# Patient Record
Sex: Female | Born: 1960 | Hispanic: No | Marital: Single | Smoking: Former smoker
Health system: Southern US, Community
[De-identification: ages and names within clinical notes are randomized; demographics above are authoritative.]

## PROBLEM LIST (undated history)

## (undated) DIAGNOSIS — M8580 Other specified disorders of bone density and structure, unspecified site: Secondary | ICD-10-CM

## (undated) HISTORY — PX: CARPAL TUNNEL RELEASE: SHX101

## (undated) HISTORY — PX: TONSILLECTOMY: SUR1361

## (undated) HISTORY — DX: Other specified disorders of bone density and structure, unspecified site: M85.80

---

## 2001-08-13 ENCOUNTER — Other Ambulatory Visit: Admission: RE | Admit: 2001-08-13 | Discharge: 2001-08-13 | Payer: Self-pay | Admitting: Obstetrics and Gynecology

## 2002-10-09 ENCOUNTER — Other Ambulatory Visit: Admission: RE | Admit: 2002-10-09 | Discharge: 2002-10-09 | Payer: Self-pay | Admitting: Obstetrics and Gynecology

## 2003-10-27 ENCOUNTER — Other Ambulatory Visit: Admission: RE | Admit: 2003-10-27 | Discharge: 2003-10-27 | Payer: Self-pay | Admitting: Obstetrics and Gynecology

## 2003-12-01 ENCOUNTER — Encounter: Admission: RE | Admit: 2003-12-01 | Discharge: 2003-12-01 | Payer: Self-pay | Admitting: Obstetrics and Gynecology

## 2005-01-10 ENCOUNTER — Other Ambulatory Visit: Admission: RE | Admit: 2005-01-10 | Discharge: 2005-01-10 | Payer: Self-pay | Admitting: Obstetrics and Gynecology

## 2006-03-07 ENCOUNTER — Other Ambulatory Visit: Admission: RE | Admit: 2006-03-07 | Discharge: 2006-03-07 | Payer: Self-pay | Admitting: Obstetrics and Gynecology

## 2007-11-19 ENCOUNTER — Encounter: Admission: RE | Admit: 2007-11-19 | Discharge: 2007-11-19 | Payer: Self-pay | Admitting: Family Medicine

## 2012-04-08 ENCOUNTER — Emergency Department (HOSPITAL_COMMUNITY)
Admission: EM | Admit: 2012-04-08 | Discharge: 2012-04-08 | Disposition: A | Discharge status: Home / Self Care | Payer: Self-pay

## 2013-06-15 ENCOUNTER — Emergency Department (HOSPITAL_COMMUNITY)
Admission: EM | Admit: 2013-06-15 | Discharge: 2013-06-15 | Disposition: A | Discharge status: Home / Self Care | Payer: BC Managed Care – PPO | Attending: Emergency Medicine | Admitting: Emergency Medicine

## 2013-06-15 ENCOUNTER — Emergency Department (HOSPITAL_COMMUNITY): Payer: BC Managed Care – PPO

## 2013-06-15 DIAGNOSIS — S71009A Unspecified open wound, unspecified hip, initial encounter: Secondary | ICD-10-CM | POA: Insufficient documentation

## 2013-06-15 DIAGNOSIS — T148XXA Other injury of unspecified body region, initial encounter: Secondary | ICD-10-CM

## 2013-06-15 DIAGNOSIS — W268XXA Contact with other sharp object(s), not elsewhere classified, initial encounter: Secondary | ICD-10-CM | POA: Insufficient documentation

## 2013-06-15 DIAGNOSIS — R42 Dizziness and giddiness: Secondary | ICD-10-CM | POA: Insufficient documentation

## 2013-06-15 DIAGNOSIS — Y9389 Activity, other specified: Secondary | ICD-10-CM | POA: Insufficient documentation

## 2013-06-15 DIAGNOSIS — S71109A Unspecified open wound, unspecified thigh, initial encounter: Secondary | ICD-10-CM | POA: Insufficient documentation

## 2013-06-15 DIAGNOSIS — Z23 Encounter for immunization: Secondary | ICD-10-CM | POA: Insufficient documentation

## 2013-06-15 DIAGNOSIS — Z79899 Other long term (current) drug therapy: Secondary | ICD-10-CM | POA: Insufficient documentation

## 2013-06-15 DIAGNOSIS — Y929 Unspecified place or not applicable: Secondary | ICD-10-CM | POA: Insufficient documentation

## 2013-06-15 MED ORDER — TETANUS-DIPHTH-ACELL PERTUSSIS 5-2.5-18.5 LF-MCG/0.5 IM SUSP
0.5000 mL | Freq: Once | INTRAMUSCULAR | Status: AC
  Administered 2013-06-15: 0.5 mL via INTRAMUSCULAR
  Filled 2013-06-15: qty 0.5

## 2013-06-15 MED ORDER — CEPHALEXIN 500 MG PO CAPS: 500.0000 mg | ORAL_CAPSULE | Freq: Four times a day (QID) | ORAL | Status: DC

## 2013-06-15 MED ORDER — HYDROCODONE-ACETAMINOPHEN 5-325 MG PO TABS: 1.0000 | ORAL_TABLET | Freq: Three times a day (TID) | ORAL | Status: DC | PRN
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
## 2013-06-15 NOTE — ED Notes (Signed)
Patient transported to X-ray 
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
## 2013-06-15 NOTE — ED Provider Notes (Signed)
Medical screening examination/treatment/procedure(s) were conducted as a shared visit with non-physician practitioner(s) and myself.  I personally evaluated the patient during the encounter.  Right thigh tender at wound and proximally, distal NVI, right foot dorsalis pedis pulse intact cap refill less than 2 seconds normal light touch right foot good foot and leg sensation and strength patient does not appreciate any weakness, d/w TSurg for f/u clinic.  Hurman Horn, MD 06/16/13 [id number]

## 2013-06-15 NOTE — ED Notes (Signed)
Report received, assumed care.  

## 2013-06-18 ENCOUNTER — Telehealth (HOSPITAL_COMMUNITY): Payer: Self-pay | Admitting: Emergency Medicine

## 2013-06-18 NOTE — Telephone Encounter (Signed)
Patient going to see PCP today to have wound checked. I told her if her PCP is comfortable taking care of the wound it would make more sense for her to have it treated there. If they want her to follow up with [place] she will call back to schedule an appt.

## 2013-12-31 ENCOUNTER — Other Ambulatory Visit: Payer: Self-pay | Admitting: Obstetrics and Gynecology

## 2013-12-31 DIAGNOSIS — N6322 Unspecified lump in the left breast, upper inner quadrant: Secondary | ICD-10-CM

## 2014-01-13 ENCOUNTER — Ambulatory Visit
Admission: RE | Admit: 2014-01-13 | Discharge: 2014-01-13 | Disposition: A | Discharge status: Home / Self Care | Payer: BC Managed Care – PPO | Source: Ambulatory Visit | Attending: Obstetrics and Gynecology | Admitting: Obstetrics and Gynecology

## 2014-01-13 ENCOUNTER — Other Ambulatory Visit: Payer: BC Managed Care – PPO

## 2014-01-13 DIAGNOSIS — N6322 Unspecified lump in the left breast, upper inner quadrant: Secondary | ICD-10-CM

## 2014-09-08 ENCOUNTER — Other Ambulatory Visit: Payer: Self-pay

## 2014-09-08 DIAGNOSIS — Z1231 Encounter for screening mammogram for malignant neoplasm of breast: Secondary | ICD-10-CM

## 2014-09-22 ENCOUNTER — Ambulatory Visit
Admission: RE | Admit: 2014-09-22 | Discharge: 2014-09-22 | Disposition: A | Discharge status: Home / Self Care | Payer: BC Managed Care – PPO | Source: Ambulatory Visit

## 2014-09-22 DIAGNOSIS — Z1231 Encounter for screening mammogram for malignant neoplasm of breast: Secondary | ICD-10-CM

## 2016-04-18 DIAGNOSIS — G5601 Carpal tunnel syndrome, right upper limb: Secondary | ICD-10-CM | POA: Diagnosis not present

## 2016-04-18 DIAGNOSIS — G5602 Carpal tunnel syndrome, left upper limb: Secondary | ICD-10-CM | POA: Diagnosis not present

## 2016-06-19 DIAGNOSIS — K5289 Other specified noninfective gastroenteritis and colitis: Secondary | ICD-10-CM | POA: Diagnosis not present

## 2016-09-12 DIAGNOSIS — Z01419 Encounter for gynecological examination (general) (routine) without abnormal findings: Secondary | ICD-10-CM | POA: Diagnosis not present

## 2016-09-12 DIAGNOSIS — Z23 Encounter for immunization: Secondary | ICD-10-CM | POA: Diagnosis not present

## 2016-09-12 DIAGNOSIS — Z1231 Encounter for screening mammogram for malignant neoplasm of breast: Secondary | ICD-10-CM | POA: Diagnosis not present

## 2016-09-12 DIAGNOSIS — Z6824 Body mass index (BMI) 24.0-24.9, adult: Secondary | ICD-10-CM | POA: Diagnosis not present

## 2016-09-14 ENCOUNTER — Other Ambulatory Visit: Payer: Self-pay | Admitting: Obstetrics and Gynecology

## 2016-09-14 DIAGNOSIS — R928 Other abnormal and inconclusive findings on diagnostic imaging of breast: Secondary | ICD-10-CM

## 2016-09-20 ENCOUNTER — Ambulatory Visit
Admission: RE | Admit: 2016-09-20 | Discharge: 2016-09-20 | Disposition: A | Discharge status: Home / Self Care | Payer: BLUE CROSS/BLUE SHIELD | Source: Ambulatory Visit | Attending: Obstetrics and Gynecology | Admitting: Obstetrics and Gynecology

## 2016-09-20 DIAGNOSIS — R928 Other abnormal and inconclusive findings on diagnostic imaging of breast: Secondary | ICD-10-CM

## 2016-09-20 DIAGNOSIS — R922 Inconclusive mammogram: Secondary | ICD-10-CM | POA: Diagnosis not present

## 2016-11-07 DIAGNOSIS — G5603 Carpal tunnel syndrome, bilateral upper limbs: Secondary | ICD-10-CM | POA: Diagnosis not present

## 2016-11-07 DIAGNOSIS — G5602 Carpal tunnel syndrome, left upper limb: Secondary | ICD-10-CM | POA: Diagnosis not present

## 2016-11-07 DIAGNOSIS — G5601 Carpal tunnel syndrome, right upper limb: Secondary | ICD-10-CM | POA: Diagnosis not present

## 2016-11-18 DIAGNOSIS — G5601 Carpal tunnel syndrome, right upper limb: Secondary | ICD-10-CM | POA: Diagnosis not present

## 2016-11-18 DIAGNOSIS — G5602 Carpal tunnel syndrome, left upper limb: Secondary | ICD-10-CM | POA: Diagnosis not present

## 2016-11-21 DIAGNOSIS — Z13820 Encounter for screening for osteoporosis: Secondary | ICD-10-CM | POA: Diagnosis not present

## 2017-01-06 DIAGNOSIS — R05 Cough: Secondary | ICD-10-CM | POA: Diagnosis not present

## 2017-01-06 DIAGNOSIS — J018 Other acute sinusitis: Secondary | ICD-10-CM | POA: Diagnosis not present

## 2017-03-09 DIAGNOSIS — Z4789 Encounter for other orthopedic aftercare: Secondary | ICD-10-CM | POA: Diagnosis not present

## 2017-03-09 DIAGNOSIS — G5603 Carpal tunnel syndrome, bilateral upper limbs: Secondary | ICD-10-CM | POA: Diagnosis not present

## 2017-06-17 IMAGING — MG 2D DIGITAL DIAGNOSTIC UNILATERAL RIGHT MAMMOGRAM WITH CAD AND AD
6 series · 6 of 14 positions shown · non-contrast
Comparison: Previous exams including recent screening mammogram
dated 09/12/2016.

CLINICAL DATA: Patient returns today to evaluate a possible right
breast asymmetry questioned on recent screening mammogram.

EXAM:
2D DIGITAL DIAGNOSTIC UNILATERAL RIGHT MAMMOGRAM WITH CAD AND
ADJUNCT TOMO

[R CC synth-2D]
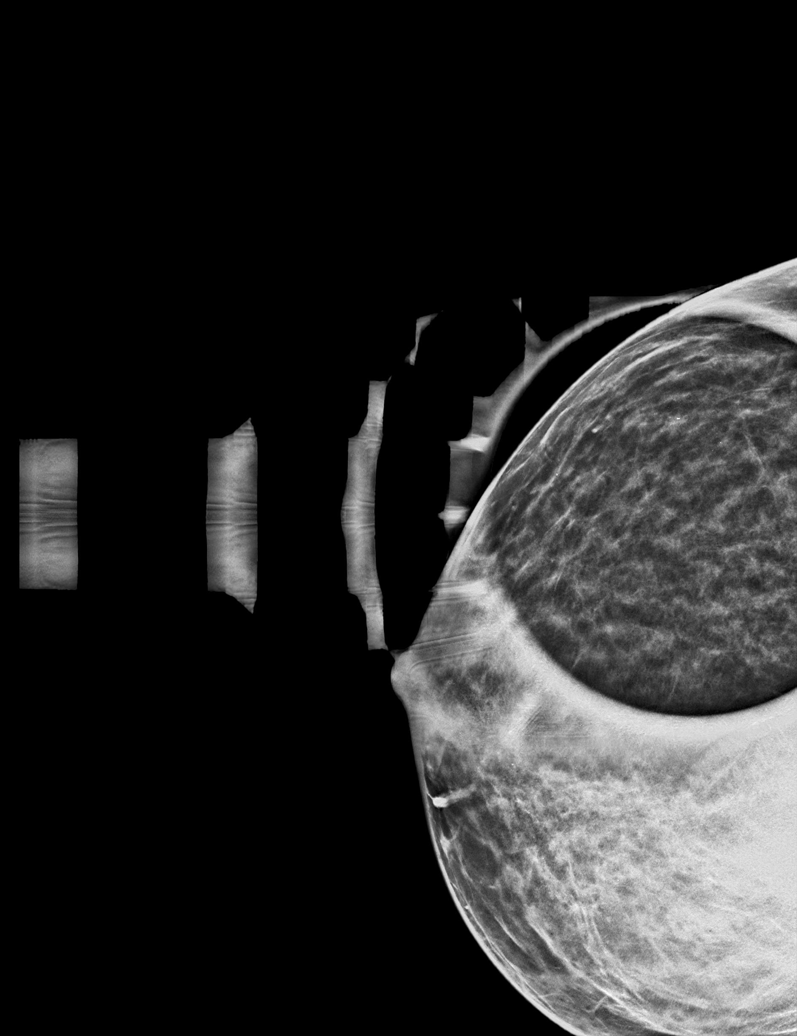

[R ML]
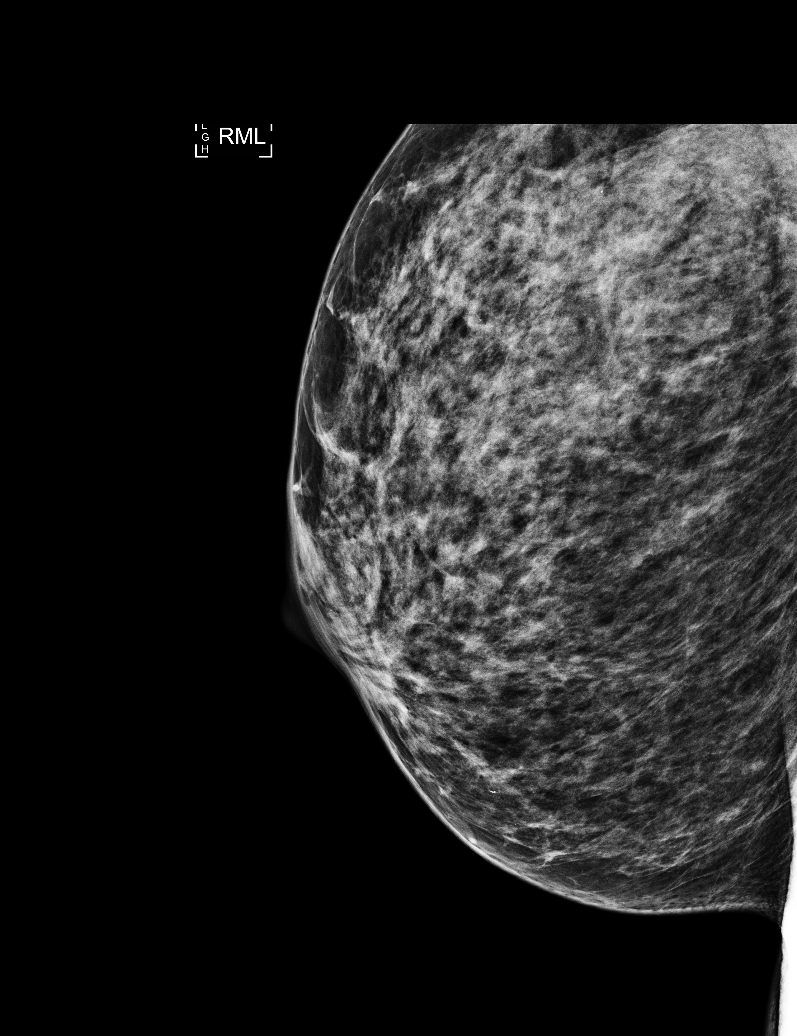

[R ML synth-2D]
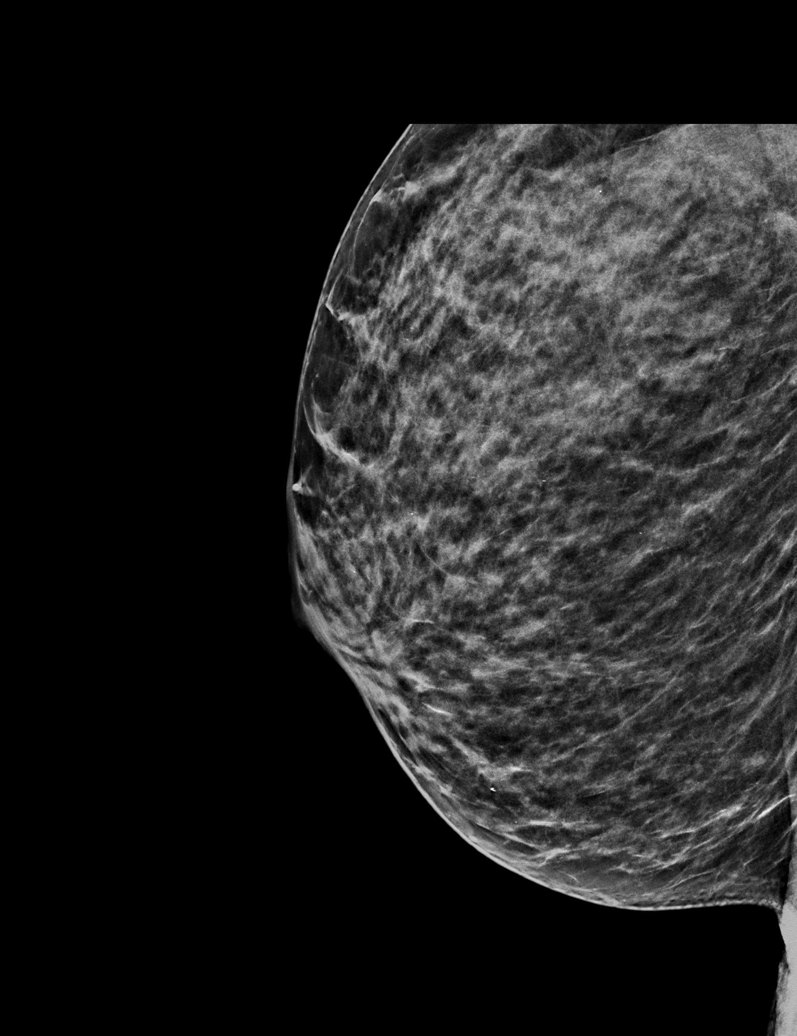

[R CC]
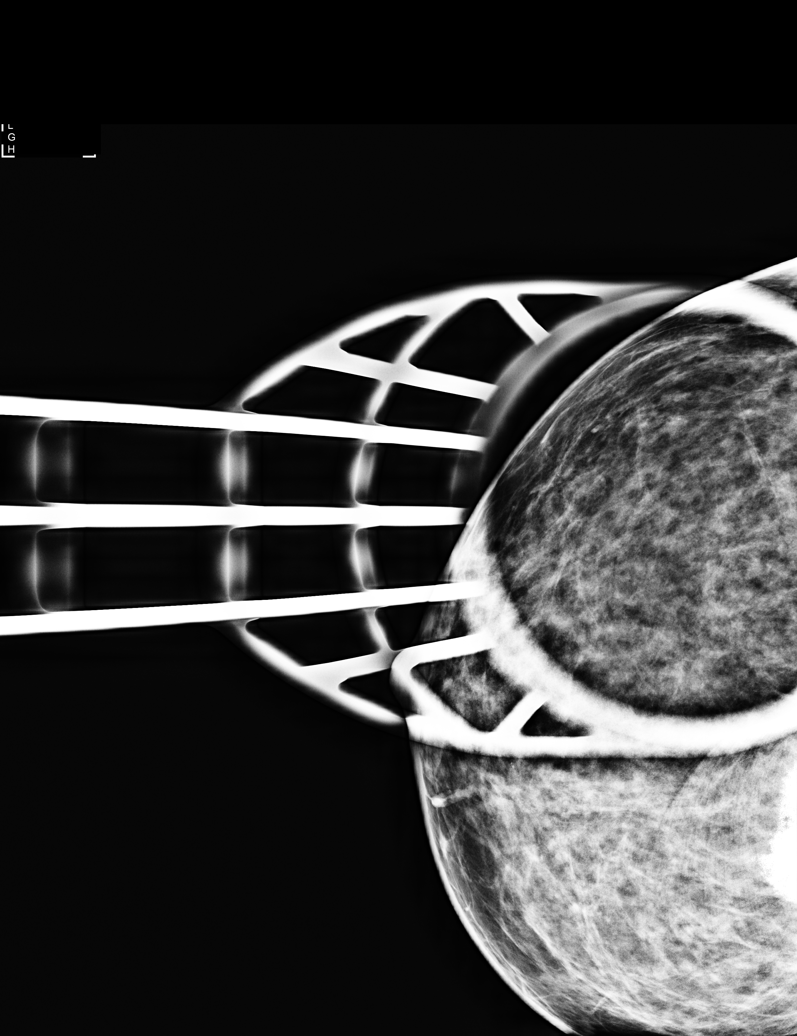

[R ML tomo · tomo slice 27/52.0]
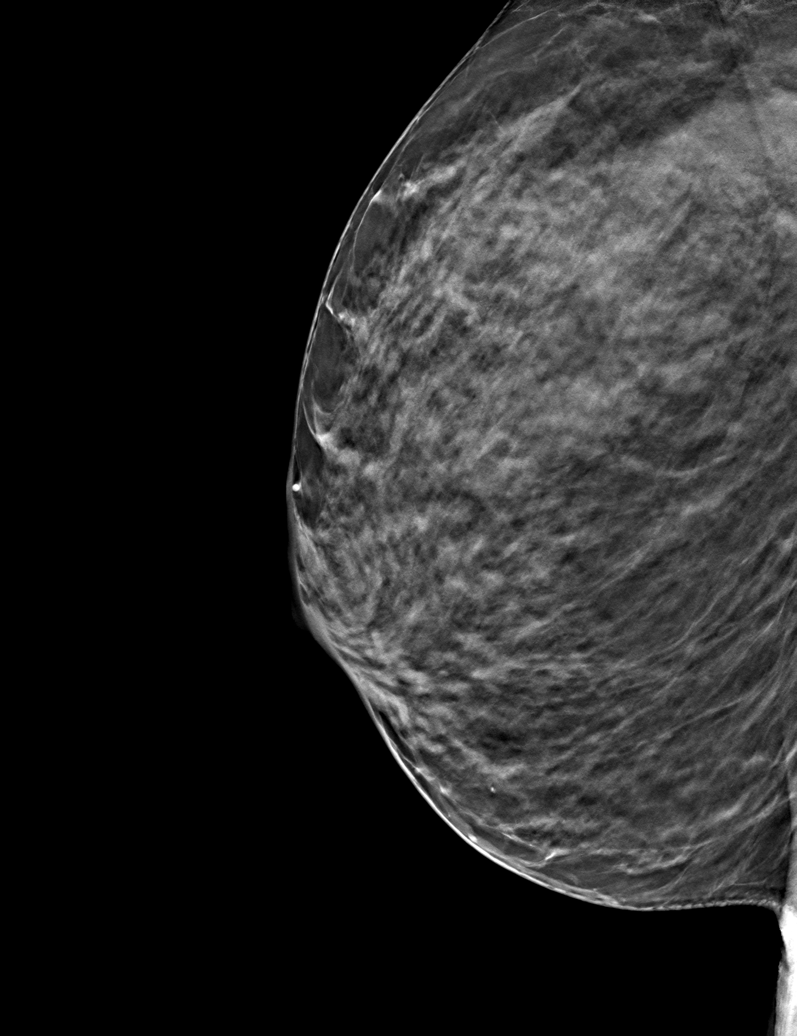

[R CC tomo · tomo slice 25/48.0]
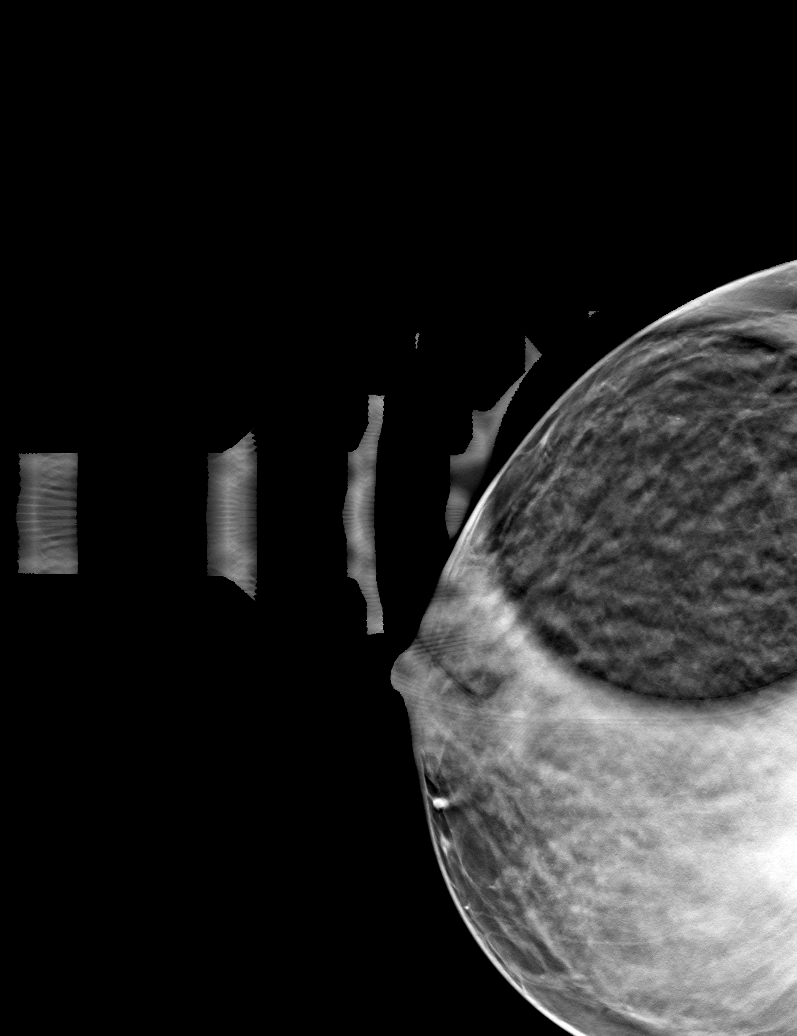

[6 of 14 positions shown; findings below may reference images not displayed]

ACR Breast Density Category c: The breast tissue is heterogeneously
dense, which may obscure small masses.
FINDINGS: On today's additional views with spot compression and 3D
tomosynthesis, there is no persistent asymmetry within the outer
right breast indicating superimposition of normal dense
fibroglandular tissues. There are no dominant masses, suspicious
calcifications or secondary signs of malignancy identified within
the right breast on today's exam.

Mammographic images were processed with CAD.
IMPRESSION: No evidence of malignancy.

Patient may return to routine annual bilateral screening mammogram
schedule.

RECOMMENDATION:
Screening mammogram in one year.(Code:[id number]-[id number])

I have discussed the findings and recommendations with the patient.
Results were also provided in writing at the conclusion of the
visit. If applicable, a reminder letter will be sent to the patient
regarding the next appointment.

BI-RADS CATEGORY  1: Negative.

## 2017-07-18 DIAGNOSIS — M25552 Pain in left hip: Secondary | ICD-10-CM | POA: Diagnosis not present

## 2017-07-20 ENCOUNTER — Ambulatory Visit
Admission: RE | Admit: 2017-07-20 | Discharge: 2017-07-20 | Disposition: A | Discharge status: Home / Self Care | Payer: BLUE CROSS/BLUE SHIELD | Source: Ambulatory Visit | Attending: Family Medicine | Admitting: Family Medicine

## 2017-07-20 ENCOUNTER — Other Ambulatory Visit: Payer: Self-pay | Admitting: Family Medicine

## 2017-07-20 DIAGNOSIS — M25552 Pain in left hip: Secondary | ICD-10-CM | POA: Diagnosis not present

## 2017-07-29 DIAGNOSIS — M25552 Pain in left hip: Secondary | ICD-10-CM | POA: Diagnosis not present

## 2017-10-02 DIAGNOSIS — Z1231 Encounter for screening mammogram for malignant neoplasm of breast: Secondary | ICD-10-CM | POA: Diagnosis not present

## 2017-10-02 DIAGNOSIS — Z01419 Encounter for gynecological examination (general) (routine) without abnormal findings: Secondary | ICD-10-CM | POA: Diagnosis not present

## 2017-10-02 DIAGNOSIS — Z6824 Body mass index (BMI) 24.0-24.9, adult: Secondary | ICD-10-CM | POA: Diagnosis not present

## 2017-10-12 DIAGNOSIS — Z13220 Encounter for screening for lipoid disorders: Secondary | ICD-10-CM | POA: Diagnosis not present

## 2017-10-12 DIAGNOSIS — Z1321 Encounter for screening for nutritional disorder: Secondary | ICD-10-CM | POA: Diagnosis not present

## 2017-10-12 DIAGNOSIS — Z13228 Encounter for screening for other metabolic disorders: Secondary | ICD-10-CM | POA: Diagnosis not present

## 2017-10-12 DIAGNOSIS — Z1329 Encounter for screening for other suspected endocrine disorder: Secondary | ICD-10-CM | POA: Diagnosis not present

## 2017-10-23 DIAGNOSIS — Z1283 Encounter for screening for malignant neoplasm of skin: Secondary | ICD-10-CM | POA: Diagnosis not present

## 2017-10-23 DIAGNOSIS — L821 Other seborrheic keratosis: Secondary | ICD-10-CM | POA: Diagnosis not present

## 2017-10-23 DIAGNOSIS — I781 Nevus, non-neoplastic: Secondary | ICD-10-CM | POA: Diagnosis not present

## 2018-01-24 ENCOUNTER — Ambulatory Visit
Admission: RE | Admit: 2018-01-24 | Discharge: 2018-01-24 | Disposition: A | Discharge status: Home / Self Care | Payer: BLUE CROSS/BLUE SHIELD | Source: Ambulatory Visit | Attending: Family Medicine | Admitting: Family Medicine

## 2018-01-24 ENCOUNTER — Other Ambulatory Visit: Payer: Self-pay | Admitting: Family Medicine

## 2018-01-24 DIAGNOSIS — M79672 Pain in left foot: Secondary | ICD-10-CM | POA: Diagnosis not present

## 2018-01-24 DIAGNOSIS — S99922A Unspecified injury of left foot, initial encounter: Secondary | ICD-10-CM | POA: Diagnosis not present

## 2018-01-24 DIAGNOSIS — W19XXXA Unspecified fall, initial encounter: Secondary | ICD-10-CM

## 2018-02-12 DIAGNOSIS — R739 Hyperglycemia, unspecified: Secondary | ICD-10-CM | POA: Diagnosis not present

## 2018-04-04 ENCOUNTER — Other Ambulatory Visit: Payer: Self-pay

## 2018-04-04 ENCOUNTER — Emergency Department (HOSPITAL_BASED_OUTPATIENT_CLINIC_OR_DEPARTMENT_OTHER): Payer: BLUE CROSS/BLUE SHIELD

## 2018-04-04 ENCOUNTER — Emergency Department (HOSPITAL_BASED_OUTPATIENT_CLINIC_OR_DEPARTMENT_OTHER)
Admission: EM | Admit: 2018-04-04 | Discharge: 2018-04-04 | Disposition: A | Discharge status: Home / Self Care | Payer: BLUE CROSS/BLUE SHIELD | Attending: Emergency Medicine | Admitting: Emergency Medicine

## 2018-04-04 ENCOUNTER — Encounter (HOSPITAL_BASED_OUTPATIENT_CLINIC_OR_DEPARTMENT_OTHER): Payer: Self-pay | Admitting: *Deleted

## 2018-04-04 DIAGNOSIS — R079 Chest pain, unspecified: Secondary | ICD-10-CM | POA: Insufficient documentation

## 2018-04-04 DIAGNOSIS — I456 Pre-excitation syndrome: Secondary | ICD-10-CM | POA: Diagnosis not present

## 2018-04-04 DIAGNOSIS — Z79899 Other long term (current) drug therapy: Secondary | ICD-10-CM | POA: Insufficient documentation

## 2018-04-04 DIAGNOSIS — R0781 Pleurodynia: Secondary | ICD-10-CM | POA: Diagnosis not present

## 2018-04-04 DIAGNOSIS — R002 Palpitations: Secondary | ICD-10-CM | POA: Diagnosis not present

## 2018-04-04 DIAGNOSIS — Z87891 Personal history of nicotine dependence: Secondary | ICD-10-CM | POA: Diagnosis not present

## 2018-04-04 LAB — CBC
HCT: 37.9 % (ref 36.0–46.0)
Hemoglobin: 13.4 g/dL (ref 12.0–15.0)
MCH: 31.9 pg (ref 26.0–34.0)
MCHC: 35.4 g/dL (ref 30.0–36.0)
MCV: 90.2 fL (ref 78.0–100.0)
Platelets: 227 10*3/uL (ref 150–400)
RBC: 4.2 MIL/uL (ref 3.87–5.11)
RDW: 11.9 % (ref 11.5–15.5)
WBC: 6 10*3/uL (ref 4.0–10.5)

## 2018-04-04 LAB — BASIC METABOLIC PANEL
Anion gap: 10 (ref 5–15)
BUN: 20 mg/dL (ref 6–20)
CO2: 26 mmol/L (ref 22–32)
Calcium: 9.4 mg/dL (ref 8.9–10.3)
Chloride: 99 mmol/L — ABNORMAL LOW (ref 101–111)
Creatinine, Ser: 0.69 mg/dL (ref 0.44–1.00)
GFR calc Af Amer: >60 mL/min (ref >60)
GFR calc non Af Amer: >60 mL/min (ref >60)
Glucose, Bld: 101 mg/dL — ABNORMAL HIGH (ref 65–99)
Potassium: 3.7 mmol/L (ref 3.5–5.1)
Sodium: 135 mmol/L (ref 135–145)

## 2018-04-04 LAB — TROPONIN I: Troponin I: <0.03 ng/mL (ref <0.03)

## 2018-04-04 MED ORDER — IOPAMIDOL (ISOVUE-370) INJECTION 76%
100.0000 mL | Freq: Once | INTRAVENOUS | Status: AC | PRN
  Administered 2018-04-04: 100 mL via INTRAVENOUS
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
## 2018-04-04 NOTE — Discharge Instructions (Addendum)
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  Follow-up with cardiology.
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
## 2018-04-06 ENCOUNTER — Ambulatory Visit (INDEPENDENT_AMBULATORY_CARE_PROVIDER_SITE_OTHER): Payer: BLUE CROSS/BLUE SHIELD | Admitting: Cardiology

## 2018-04-06 ENCOUNTER — Encounter: Payer: Self-pay | Admitting: Cardiology

## 2018-04-06 VITALS — BP 122/70 | HR 68 | Ht 60.5 in | Wt 128.8 lb

## 2018-04-06 DIAGNOSIS — R079 Chest pain, unspecified: Secondary | ICD-10-CM

## 2018-04-06 NOTE — Patient Instructions (Signed)
Medication Instructions:  Your physician recommends that you continue on your current medications as directed. Please refer to the Current Medication list given to you today.  Labwork: None  Testing/Procedures: Your physician has requested that you have a stress echocardiogram. For further information please visit [email or web address]. Please follow instruction sheet as given.   Follow-Up: Your physician recommends that you schedule a follow-up appointment in: As needed  Any Other Special Instructions Will Be Listed Below (If Applicable).     If you need a refill on your cardiac medications before your next appointment, please call your pharmacy.   [facility]  Garey HamAshley A, RN, BSN    Cardiopulmonary Exercise Stress Test Cardiopulmonary exercise testing (CPET) is a test that checks how your heart and lungs react to exercise. This is called your exercise capacity. During this test, you will walk or run on a treadmill or pedal on a stationary bike while tests are done on your heart and lungs. You may have this test to:  See why you are short of breath.  Check for exercise intolerance.  See how your lungs work.  See how your heart works.  Check for how you are responding to a heart or lung rehabilitation program.  See if you have a heart or lung problem.  See if you are healthy enough to have surgery.  What happens before the procedure?  Follow instructions from your doctor about what you cannot eat or drink.  Ask your doctor about changing or stopping your normal medicines. This is important if you take diabetes medicines or blood thinners.  Wear loose, comfortable clothing and shoes.  If you use an inhaler, bring it with you to the test. What happens during the procedure?  A blood pressure cuff will be placed on your arm.  Several stick-on patches (electrodes) will be placed on your chest. They will be attached to an electrocardiogram (EKG) machine.  A  clip-on monitor that measures the amount of oxygen in your blood will be placed on your finger (pulse oximeter).  A clip will be placed on your nose and a mouthpiece will be placed in your mouth. This may be held in place with a headpiece. You will breathe through the mouthpiece during the test.  You will be asked to start exercising. You will be closely watched while you exercise.  The amount of effort for your exercise will be gradually increased.  During exercise, the test will measure: ? Your heart rate. ? Your heart rhythm. ? Your oxygen blood level. ? The amount of oxygen and carbon dioxide that you breathe out.  The test will end when: ? You have finished the test. ? You have reached your maximum ability to exercise. ? You have chest or leg pain, dizziness, or shortness of breath. The procedure may vary among doctors and hospitals. What happens after the procedure?  Your blood pressure and EKG will be checked to watch how you recover from the test. This information is not intended to replace advice given to you by your health care provider. Make sure you discuss any questions you have with your health care provider. Document Released: 11/23/2009 Document Revised: 04/26/2016 Document Reviewed: 10/19/2015 Elsevier Interactive Patient Education  2018 [employer]

## 2018-04-06 NOTE — Progress Notes (Signed)
Cardiology Office Note:    Date:  04/06/2018   ID:  Katrina Warren, DOB 11/27/1961, MRN [id number]  PCP:  Shaune PollackGates, Donna, MD  Cardiologist:  Garwin Brothersajan R Ceclia Koker, MD   Referring MD: Shaune PollackGates, Donna, MD    ASSESSMENT:    1. Chest pain, unspecified type    PLAN:    In order of problems listed above:  1. Primary prevention stressed with the patient.  Importance of compliance with diet and exercise stressed and she vocalized understanding.  She has an excellent exercise program.  To reassure her I will obtain a stat exercise stress echo.  This will also help me assess murmur heard on auscultation.  Overall I think she is in excellent health and health maintenance issues such as annual lipids are followed by her primary care physician.  If her stress test is negative then she will be followed on a as needed basis only.   Medication Adjustments/Labs and Tests Ordered: Current medicines are reviewed at length with the patient today.  Concerns regarding medicines are outlined above.  Orders Placed This Encounter  Procedures  . ECHOCARDIOGRAM STRESS TEST   No orders of the defined types were placed in this encounter.    History of Present Illness:    Katrina Warren is a 57 y.o. female who is being seen today for the evaluation of chest pain at the request of Shaune PollackGates, Donna, MD.  Patient is a pleasant 57 year old female who is otherwise healthy.  She mentions to me that she occasionally has a skipped beat sensation.  This has some chest discomfort to it.  No orthopnea or PND.  She is very concerned about the symptoms.  She denies any significant history from a cardiovascular standpoint.  No history of hypertension, diabetes mellitus and dyslipidemia.  She exercises on a regular basis 30-40 minutes a day without any symptoms.  At the time of my evaluation, the patient is alert awake oriented and in no distress.  At the time of my evaluation, the patient is alert awake oriented and in no  distress.  History reviewed. No pertinent past medical history.  Past Surgical History:  Procedure Laterality Date  . CARPAL TUNNEL RELEASE    . TONSILLECTOMY      Current Medications: Current Meds  Medication Sig  . Ascorbic Acid (VITAMIN C) 100 MG tablet Take 100 mg by mouth daily.  Marland Kitchen. b complex vitamins tablet Take 1 tablet by mouth daily.  . cholecalciferol (VITAMIN D) 1000 units tablet Take 1,000 Units by mouth daily.  . magnesium oxide (MAG-OX) 400 MG tablet Take 400 mg by mouth daily.  Marland Kitchen. OVER THE COUNTER MEDICATION Take 1 tablet by mouth daily. lysene 500mg   . zolpidem (AMBIEN) 5 MG tablet Take 5 mg by mouth at bedtime as needed for sleep.     Allergies:   Patient has no known allergies.   Social History   Socioeconomic History  . Marital status: Single    Spouse name: Not on file  . Number of children: Not on file  . Years of education: Not on file  . Highest education level: Not on file  Occupational History  . Not on file  Social Needs  . Financial resource strain: Not on file  . Food insecurity:    Worry: Not on file    Inability: Not on file  . Transportation needs:    Medical: Not on file    Non-medical: Not on file  Tobacco Use  . Smoking status:  Former Smoker  . Smokeless tobacco: Never Used  Substance and Sexual Activity  . Alcohol use: Never    Frequency: Never  . Drug use: Never  . Sexual activity: Not on file  Lifestyle  . Physical activity:    Days per week: Not on file    Minutes per session: Not on file  . Stress: Not on file  Relationships  . Social connections:    Talks on phone: Not on file    Gets together: Not on file    Attends religious service: Not on file    Active member of club or organization: Not on file    Attends meetings of clubs or organizations: Not on file    Relationship status: Not on file  Other Topics Concern  . Not on file  Social History Narrative  . Not on file     Family History: The patient's family  history is not on file.  ROS:   Please see the history of present illness.    All other systems reviewed and are negative.  EKGs/Labs/Other Studies Reviewed:    The following studies were reviewed today: EKG reveals sinus rhythm and nonspecific ST-T changes   Recent Labs: 04/04/2018: BUN 20; Creatinine, Ser 0.69; Hemoglobin 13.4; Platelets 227; Potassium 3.7; Sodium 135  Recent Lipid Panel No results found for: CHOL, TRIG, HDL, CHOLHDL, VLDL, LDLCALC, LDLDIRECT  Physical Exam:    VS:  BP 122/70 (BP Location: Left Arm, Patient Position: Sitting, Cuff Size: Normal)   Pulse 68   Ht 5' 0.5" (1.537 m)   Wt 128 lb 12.8 oz (58.4 kg)   SpO2 99%   BMI 24.74 kg/m     Wt Readings from Last 3 Encounters:  04/06/18 128 lb 12.8 oz (58.4 kg)  04/04/18 125 lb (56.7 kg)     GEN: Patient is in no acute distress HEENT: Normal NECK: No JVD; No carotid bruits LYMPHATICS: No lymphadenopathy CARDIAC: S1 S2 regular, 2/6 systolic murmur at the apex. RESPIRATORY:  Clear to auscultation without rales, wheezing or rhonchi  ABDOMEN: Soft, non-tender, non-distended MUSCULOSKELETAL:  No edema; No deformity  SKIN: Warm and dry NEUROLOGIC:  Alert and oriented x 3 PSYCHIATRIC:  Normal affect    Signed, Garwin Brothers, MD  04/06/2018 8:40 AM    [facility]

## 2018-05-04 ENCOUNTER — Other Ambulatory Visit (HOSPITAL_BASED_OUTPATIENT_CLINIC_OR_DEPARTMENT_OTHER): Payer: BLUE CROSS/BLUE SHIELD

## 2018-05-11 ENCOUNTER — Ambulatory Visit (HOSPITAL_BASED_OUTPATIENT_CLINIC_OR_DEPARTMENT_OTHER)
Admission: RE | Admit: 2018-05-11 | Discharge: 2018-05-11 | Disposition: A | Discharge status: Home / Self Care | Payer: BLUE CROSS/BLUE SHIELD | Source: Ambulatory Visit | Attending: Cardiology | Admitting: Cardiology

## 2018-05-11 DIAGNOSIS — R079 Chest pain, unspecified: Secondary | ICD-10-CM | POA: Insufficient documentation

## 2018-05-11 NOTE — Progress Notes (Signed)
Echocardiogram Echocardiogram Stress Test has been performed.  [name] 05/11/2018, 3:35 PM

## 2018-05-15 ENCOUNTER — Telehealth: Payer: Self-pay

## 2018-05-15 NOTE — Telephone Encounter (Signed)
Left a detailed voicemail on patients phone about results

## 2018-10-15 DIAGNOSIS — Z6822 Body mass index (BMI) 22.0-22.9, adult: Secondary | ICD-10-CM | POA: Diagnosis not present

## 2018-10-15 DIAGNOSIS — Z1231 Encounter for screening mammogram for malignant neoplasm of breast: Secondary | ICD-10-CM | POA: Diagnosis not present

## 2018-10-15 DIAGNOSIS — Z01419 Encounter for gynecological examination (general) (routine) without abnormal findings: Secondary | ICD-10-CM | POA: Diagnosis not present

## 2018-10-22 DIAGNOSIS — Z1321 Encounter for screening for nutritional disorder: Secondary | ICD-10-CM | POA: Diagnosis not present

## 2018-10-22 DIAGNOSIS — Z1329 Encounter for screening for other suspected endocrine disorder: Secondary | ICD-10-CM | POA: Diagnosis not present

## 2018-10-22 DIAGNOSIS — Z13228 Encounter for screening for other metabolic disorders: Secondary | ICD-10-CM | POA: Diagnosis not present

## 2018-10-22 DIAGNOSIS — Z13220 Encounter for screening for lipoid disorders: Secondary | ICD-10-CM | POA: Diagnosis not present

## 2018-11-01 DIAGNOSIS — S6990XA Unspecified injury of unspecified wrist, hand and finger(s), initial encounter: Secondary | ICD-10-CM | POA: Diagnosis not present

## 2018-11-08 DIAGNOSIS — S62619A Displaced fracture of proximal phalanx of unspecified finger, initial encounter for closed fracture: Secondary | ICD-10-CM | POA: Insufficient documentation

## 2018-11-08 DIAGNOSIS — M79642 Pain in left hand: Secondary | ICD-10-CM | POA: Insufficient documentation

## 2018-12-17 DIAGNOSIS — H40013 Open angle with borderline findings, low risk, bilateral: Secondary | ICD-10-CM | POA: Diagnosis not present

## 2019-01-04 DIAGNOSIS — L821 Other seborrheic keratosis: Secondary | ICD-10-CM | POA: Diagnosis not present

## 2019-01-04 DIAGNOSIS — I781 Nevus, non-neoplastic: Secondary | ICD-10-CM | POA: Diagnosis not present

## 2019-01-04 DIAGNOSIS — D225 Melanocytic nevi of trunk: Secondary | ICD-10-CM | POA: Diagnosis not present

## 2019-01-04 DIAGNOSIS — L820 Inflamed seborrheic keratosis: Secondary | ICD-10-CM | POA: Diagnosis not present

## 2019-01-04 DIAGNOSIS — Z1283 Encounter for screening for malignant neoplasm of skin: Secondary | ICD-10-CM | POA: Diagnosis not present

## 2019-09-02 DIAGNOSIS — M25561 Pain in right knee: Secondary | ICD-10-CM | POA: Diagnosis not present

## 2019-09-02 DIAGNOSIS — M25562 Pain in left knee: Secondary | ICD-10-CM | POA: Diagnosis not present

## 2019-09-02 DIAGNOSIS — M7051 Other bursitis of knee, right knee: Secondary | ICD-10-CM | POA: Diagnosis not present

## 2019-09-02 DIAGNOSIS — M2241 Chondromalacia patellae, right knee: Secondary | ICD-10-CM | POA: Diagnosis not present

## 2019-10-14 DIAGNOSIS — M2242 Chondromalacia patellae, left knee: Secondary | ICD-10-CM | POA: Diagnosis not present

## 2019-10-14 DIAGNOSIS — M2241 Chondromalacia patellae, right knee: Secondary | ICD-10-CM | POA: Diagnosis not present

## 2019-10-14 DIAGNOSIS — M7051 Other bursitis of knee, right knee: Secondary | ICD-10-CM | POA: Diagnosis not present

## 2019-10-21 DIAGNOSIS — Z1231 Encounter for screening mammogram for malignant neoplasm of breast: Secondary | ICD-10-CM | POA: Diagnosis not present

## 2019-10-21 DIAGNOSIS — Z6823 Body mass index (BMI) 23.0-23.9, adult: Secondary | ICD-10-CM | POA: Diagnosis not present

## 2019-10-21 DIAGNOSIS — Z01419 Encounter for gynecological examination (general) (routine) without abnormal findings: Secondary | ICD-10-CM | POA: Diagnosis not present

## 2019-10-23 ENCOUNTER — Other Ambulatory Visit: Payer: Self-pay | Admitting: Obstetrics and Gynecology

## 2019-10-23 DIAGNOSIS — R928 Other abnormal and inconclusive findings on diagnostic imaging of breast: Secondary | ICD-10-CM

## 2019-10-24 ENCOUNTER — Other Ambulatory Visit: Payer: Self-pay

## 2019-10-24 ENCOUNTER — Ambulatory Visit
Admission: RE | Admit: 2019-10-24 | Discharge: 2019-10-24 | Disposition: A | Discharge status: Home / Self Care | Payer: BC Managed Care – PPO | Source: Ambulatory Visit | Attending: Obstetrics and Gynecology | Admitting: Obstetrics and Gynecology

## 2019-10-24 ENCOUNTER — Ambulatory Visit: Payer: BLUE CROSS/BLUE SHIELD

## 2019-10-24 DIAGNOSIS — R928 Other abnormal and inconclusive findings on diagnostic imaging of breast: Secondary | ICD-10-CM

## 2019-10-24 DIAGNOSIS — R922 Inconclusive mammogram: Secondary | ICD-10-CM | POA: Diagnosis not present

## 2019-11-25 DIAGNOSIS — Z13820 Encounter for screening for osteoporosis: Secondary | ICD-10-CM | POA: Diagnosis not present

## 2019-11-25 DIAGNOSIS — Z13228 Encounter for screening for other metabolic disorders: Secondary | ICD-10-CM | POA: Diagnosis not present

## 2019-11-25 DIAGNOSIS — Z1329 Encounter for screening for other suspected endocrine disorder: Secondary | ICD-10-CM | POA: Diagnosis not present

## 2019-11-25 DIAGNOSIS — Z13220 Encounter for screening for lipoid disorders: Secondary | ICD-10-CM | POA: Diagnosis not present

## 2019-11-25 DIAGNOSIS — Z1321 Encounter for screening for nutritional disorder: Secondary | ICD-10-CM | POA: Diagnosis not present

## 2019-12-10 DIAGNOSIS — H40013 Open angle with borderline findings, low risk, bilateral: Secondary | ICD-10-CM | POA: Diagnosis not present

## 2020-03-04 DIAGNOSIS — R739 Hyperglycemia, unspecified: Secondary | ICD-10-CM | POA: Diagnosis not present

## 2020-03-04 DIAGNOSIS — E785 Hyperlipidemia, unspecified: Secondary | ICD-10-CM | POA: Diagnosis not present

## 2020-05-25 DIAGNOSIS — H1131 Conjunctival hemorrhage, right eye: Secondary | ICD-10-CM | POA: Diagnosis not present

## 2020-05-27 ENCOUNTER — Telehealth: Payer: Self-pay | Admitting: Cardiology

## 2020-05-27 DIAGNOSIS — R55 Syncope and collapse: Secondary | ICD-10-CM | POA: Diagnosis not present

## 2020-05-27 NOTE — Telephone Encounter (Signed)
Okay 

## 2020-05-27 NOTE — Telephone Encounter (Signed)
Patient requesting to switch from Dr. Tomie China to Dr. Katrinka Blazing.

## 2020-05-28 NOTE — Telephone Encounter (Signed)
ok 

## 2020-06-14 NOTE — Progress Notes (Signed)
Cardiology Office Note:    Date:  06/15/2020   ID:  [name], DOB Katrina 03, 1962, MRN [id number]  PCP:  Shaune Pollack, MD (Inactive)  Cardiologist:  No primary care provider on file.   Referring MD: Shon Hale, *   Chief Complaint  Patient presents with  . Loss of Consciousness    History of Present Illness:    Katrina Warren is a 59 y.o. female with a hx of chest pain but negative prior cardiac work-up. Switching from Dr. Tomie China.  On 05/29/2020 after drinking several glasses of wine while at an outdoor venue, the patient began feeling weak when she and her boyfriend were standing and trying to check out.  She went ahead to the car to sit down but after getting to the car had sudden collapse and head injury.  She fell to an asphalt parking lot and had a periorbital ecchymosis the next day.  She was never formally seen in an emergency room but went to an urgent care center.  No x-rays were performed.  She is not having headache or any difficulty with mentation.  She has felt well since the incident nearly 3 weeks ago.  No prior history of syncope.  She is adopted and therefore does not know her family history.  She is an active person and has not had any difficulty with shortness of breath, chest pain, or palpitations.   Past Medical History:  Diagnosis Date  . Osteopenia     Past Surgical History:  Procedure Laterality Date  . CARPAL TUNNEL RELEASE    . TONSILLECTOMY      Current Medications: Current Meds  Medication Sig  . Ascorbic Acid (VITAMIN C) 100 MG tablet Take 1,000 mg by mouth daily.   Marland Kitchen b complex vitamins tablet Take 1 tablet by mouth daily.  . cholecalciferol (VITAMIN D) 1000 units tablet Take 2,000 Units by mouth daily.   . magnesium oxide (MAG-OX) 400 MG tablet Take 400 mg by mouth daily.  Marland Kitchen OVER THE COUNTER MEDICATION Take 1 tablet by mouth daily. lysene 500mg   . Turmeric 500 MG TABS Take 1,000 mg by mouth daily.  zolpidem (AMBIEN) 5 MG  tablet Take 5 mg by mouth at bedtime as needed for sleep.     Allergies:   Patient has no known allergies.   Social History   Socioeconomic History  . Marital status: Single    Spouse name: Not on file  . Number of children: Not on file  . Years of education: Not on file  . Highest education level: Not on file  Occupational History  . Not on file  Tobacco Use  . Smoking status: Former Marland Kitchen  . Smokeless tobacco: Never Used  Substance and Sexual Activity  . Alcohol use: Yes    Alcohol/week: 6.0 standard drinks    Types: 6 Glasses of wine per week  . Drug use: Never  . Sexual activity: Not on file  Other Topics Concern  . Not on file  Social History Narrative  . Not on file   Social Determinants of Health   Financial Resource Strain:   . Difficulty of Paying Living Expenses:   Food Insecurity:   . Worried About Games developer in the Last Year:   . Programme researcher, broadcasting/film/video in the Last Year:   Transportation Needs:   . Barista (Medical):   Freight forwarder Lack of Transportation (Non-Medical):   Physical Activity:   . Days of Exercise  per Week:   . Minutes of Exercise per Session:   Stress:   . Feeling of Stress :   Social Connections:   . Frequency of Communication with Friends and Family:   . Frequency of Social Gatherings with Friends and Family:   . Attends Religious Services:   . Active Member of Clubs or Organizations:   . Attends Banker Meetings:   Marland Kitchen Marital Status:      Family History: The patient's family history is not on file. She was adopted.  ROS:   Please see the history of present illness.    No prior history of syncope.  No known significant chronic medical problems.  Previously demonstrated normal LV size and function by stress echo all other systems reviewed and are negative.  EKGs/Labs/Other Studies Reviewed:    The following studies were reviewed today:  STRESS ECHO 04/2018 Study Conclusions   - Stress ECG conclusions: There  were no stress arrhythmias or  conduction abnormalities. The stress ECG was negative for  ischemia.  - Staged echo: There was no echocardiographic evidence for  stress-induced ischemia.   Impressions:   - Good exercise tolerance.  No chest pain during the exercise.  NO evidence of ischemia base on ecg or Echo criteria.  Overall NEGATIVE stress echo for exercise induced ischemia.    EKG:  EKG ECG is normal.  Normal sinus rhythm is noted.  No evidence of hypertrophy or repolarization abnormality is seen.  PR interval is normal.  Recent Labs: No results found for requested labs within last 8760 hours.  Recent Lipid Panel No results found for: CHOL, TRIG, HDL, CHOLHDL, VLDL, LDLCALC, LDLDIRECT  Physical Exam:    VS:  BP 98/68   Pulse 67   Ht 5' 0.5" (1.537 m)   Wt 121 lb 6.4 oz (55.1 kg)   SpO2 98%   BMI 23.32 kg/m     Wt Readings from Last 3 Encounters:  06/15/20 121 lb 6.4 oz (55.1 kg)  04/06/18 128 lb 12.8 oz (58.4 kg)  04/04/18 125 lb (56.7 kg)    Repeat blood pressure 120/82 mmHg sitting, heart rate 68 bpm --> standing blood pressure 118/79 with heart rate 80 bpm. GEN: Healthy-appearing. No acute distress HEENT: Normal NECK: No JVD. LYMPHATICS: No lymphadenopathy CARDIAC:  RRR without murmur, gallop, or edema. VASCULAR:  Normal Pulses. No bruits. RESPIRATORY:  Clear to auscultation without rales, wheezing or rhonchi  ABDOMEN: Soft, non-tender, non-distended, No pulsatile mass, MUSCULOSKELETAL: No deformity  SKIN: Warm and dry NEUROLOGIC:  Alert and oriented x 3 PSYCHIATRIC:  Normal affect   ASSESSMENT:    1. Syncope and collapse   2. Chest pain, unspecified type    PLAN:    In order of problems listed above:  1. No prior history of syncope.  Premonitory symptoms suggest neurally mediated episode in association with alcohol intake in a hot and humid outdoor venue.  30-day monitor will be done to screen for any signs of malignant  arrhythmia. 2. Previously worked up for chest pain in 2019 and had a normal stress echo with normal LV size and function.  Precautions concerning potential vasovagal syncope were discussed.  Cutting back on alcohol is encouraged.  30-day monitor is being done to exclude the possibility of malignant arrhythmia.   Medication Adjustments/Labs and Tests Ordered: Current medicines are reviewed at length with the patient today.  Concerns regarding medicines are outlined above.  Orders Placed This Encounter  Procedures  . Cardiac event monitor  .  EKG 12-Lead   No orders of the defined types were placed in this encounter.   Patient Instructions  Medication Instructions:  Your physician recommends that you continue on your current medications as directed. Please refer to the Current Medication list given to you today.  *If you need a refill on your cardiac medications before your next appointment, please call your pharmacy*   Lab Work: None If you have labs (blood work) drawn today and your tests are completely normal, you will receive your results only by: Marland Kitchen MyChart Message (if you have MyChart) OR . A paper copy in the mail If you have any lab test that is abnormal or we need to change your treatment, we will call you to review the results.   Testing/Procedures: Your physician has recommended that you wear an event monitor. Event monitors are medical devices that record the heart's electrical activity. Doctors most often [place] these monitors to diagnose arrhythmias. Arrhythmias are problems with the speed or rhythm of the heartbeat. The monitor is a small, portable device. You can wear one while you do your normal daily activities. This is usually used to diagnose what is causing palpitations/syncope (passing out).     Follow-Up: At [facility], you and your health needs are our priority.  As part of our continuing mission to provide you with exceptional heart care, we have created  designated Provider Care Teams.  These Care Teams include your primary Cardiologist (physician) and Advanced Practice Providers (APPs -  Physician Assistants and Nurse Practitioners) who all work together to provide you with the care you need, when you need it.  We recommend signing up for the patient portal called "MyChart".  Sign up information is provided on this After Visit Summary.  MyChart is used to connect with patients for Virtual Visits (Telemedicine).  Patients are able to view lab/test results, encounter notes, upcoming appointments, etc.  Non-urgent messages can be sent to your provider as well.   To learn more about what you can do with MyChart, go to [email or web address].    Your next appointment:   As needed  The format for your next appointment:   In Person  Provider:   You may see Dr. Daneen Schick or one of the following Advanced Practice Providers on your designated Care Team:    Truitt Merle, NP  Cecilie Kicks, NP  Kathyrn Drown, NP    Other Instructions      Signed, Sinclair Grooms, MD  06/15/2020 3:05 PM    [place]

## 2020-06-15 ENCOUNTER — Ambulatory Visit (INDEPENDENT_AMBULATORY_CARE_PROVIDER_SITE_OTHER): Payer: BC Managed Care – PPO | Admitting: Interventional Cardiology

## 2020-06-15 ENCOUNTER — Other Ambulatory Visit: Payer: Self-pay

## 2020-06-15 ENCOUNTER — Telehealth: Payer: Self-pay

## 2020-06-15 ENCOUNTER — Encounter: Payer: Self-pay | Admitting: Interventional Cardiology

## 2020-06-15 VITALS — BP 98/68 | HR 67 | Ht 60.5 in | Wt 121.4 lb

## 2020-06-15 DIAGNOSIS — R079 Chest pain, unspecified: Secondary | ICD-10-CM

## 2020-06-15 DIAGNOSIS — R55 Syncope and collapse: Secondary | ICD-10-CM

## 2020-06-15 NOTE — Telephone Encounter (Signed)
30 day Event Monitor registered to be mailed to pt's home address.  

## 2020-06-15 NOTE — Patient Instructions (Signed)
Medication Instructions:  °Your physician recommends that you continue on your current medications as directed. Please refer to the Current Medication list given to you today. ° °*If you need a refill on your cardiac medications before your next appointment, please call your pharmacy* ° ° °Lab Work: °None °If you have labs (blood work) drawn today and your tests are completely normal, you will receive your results only by: °• MyChart Message (if you have MyChart) OR °• A paper copy in the mail °If you have any lab test that is abnormal or we need to change your treatment, we will call you to review the results. ° ° °Testing/Procedures: °Your physician has recommended that you wear an event monitor. Event monitors are medical devices that record the heart’s electrical activity. Doctors most often us these monitors to diagnose arrhythmias. Arrhythmias are problems with the speed or rhythm of the heartbeat. The monitor is a small, portable device. You can wear one while you do your normal daily activities. This is usually used to diagnose what is causing palpitations/syncope (passing out). ° ° ° °Follow-Up: °At [facility], you and your health needs are our priority.  As part of our continuing mission to provide you with exceptional heart care, we have created designated Provider Care Teams.  These Care Teams include your primary Cardiologist (physician) and Advanced Practice Providers (APPs -  Physician Assistants and Nurse Practitioners) who all work together to provide you with the care you need, when you need it. ° °We recommend signing up for the patient portal called "MyChart".  Sign up information is provided on this After Visit Summary.  MyChart is used to connect with patients for Virtual Visits (Telemedicine).  Patients are able to view lab/test results, encounter notes, upcoming appointments, etc.  Non-urgent messages can be sent to your provider as well.   °To learn more about what you can do with  MyChart, go to [email or web address].   ° °Your next appointment:   °As needed ° °The format for your next appointment:   °In Person ° °Provider:   °You may see Dr. Henry Smith or one of the following Advanced Practice Providers on your designated Care Team:   °· Lori Gerhardt, NP °· Laura Ingold, NP °· Jill McDaniel, NP ° ° ° °Other Instructions ° ° °

## 2020-06-23 ENCOUNTER — Encounter (INDEPENDENT_AMBULATORY_CARE_PROVIDER_SITE_OTHER): Payer: BC Managed Care – PPO

## 2020-06-23 ENCOUNTER — Encounter: Payer: Self-pay | Admitting: Interventional Cardiology

## 2020-06-23 DIAGNOSIS — R55 Syncope and collapse: Secondary | ICD-10-CM | POA: Diagnosis not present

## 2020-06-26 ENCOUNTER — Ambulatory Visit: Payer: Self-pay | Admitting: Cardiology

## 2020-07-06 ENCOUNTER — Telehealth: Payer: Self-pay | Admitting: Interventional Cardiology

## 2020-07-06 NOTE — Telephone Encounter (Signed)
Spoke with patient who states she pressed the button today on her monitor because she felt her heart racing.  The episode last ed about 45 mins total per her report.  She did not try to check her heart rate at the time.  She is feeling fine now. She does report this has happened in the past prior to wearing the monitor.

## 2020-07-06 NOTE — Telephone Encounter (Signed)
Patient returning call.

## 2020-07-06 NOTE — Telephone Encounter (Signed)
Crystal with [employer] is calling to report a serious EKG notification.

## 2020-07-06 NOTE — Telephone Encounter (Signed)
Crystal had disconnected the call.  Called # listed and received the information she was calling to report pt had an episode of SVT lasting approximately 1 minute with average HR 177 bpm.  They will fax the report.  Attempted to contact pt by phone - left message to c/b to discuss how she is feeling and if she was aware of a rapid heart rate.

## 2020-07-07 ENCOUNTER — Encounter: Payer: Self-pay | Admitting: Interventional Cardiology

## 2020-07-07 DIAGNOSIS — I471 Supraventricular tachycardia: Secondary | ICD-10-CM | POA: Insufficient documentation

## 2020-07-07 NOTE — Telephone Encounter (Signed)
PSVT documented.  Heart rates 180 bpm.  Office note with systolic blood pressure in the 95 to 100 mmHg systolic range.  Start Toprol-XL 12.5 mg/day and follow.  Continue to monitor.

## 2020-07-08 MED ORDER — METOPROLOL SUCCINATE ER 25 MG PO TB24: 12.5000 mg | ORAL_TABLET | Freq: Every day | ORAL | 3 refills | Status: DC

## 2020-07-08 NOTE — Telephone Encounter (Signed)
Spoke with pt and reviewed recommendations per Dr. Smith.  Pt verbalized understanding and was in agreement with this plan.  

## 2020-08-03 DIAGNOSIS — Z1211 Encounter for screening for malignant neoplasm of colon: Secondary | ICD-10-CM | POA: Diagnosis not present

## 2020-08-17 ENCOUNTER — Ambulatory Visit: Payer: BC Managed Care – PPO | Admitting: Interventional Cardiology

## 2020-08-17 DIAGNOSIS — M25551 Pain in right hip: Secondary | ICD-10-CM | POA: Diagnosis not present

## 2020-10-27 DIAGNOSIS — Z1231 Encounter for screening mammogram for malignant neoplasm of breast: Secondary | ICD-10-CM | POA: Diagnosis not present

## 2020-10-27 DIAGNOSIS — Z01419 Encounter for gynecological examination (general) (routine) without abnormal findings: Secondary | ICD-10-CM | POA: Diagnosis not present

## 2020-10-27 DIAGNOSIS — Z6824 Body mass index (BMI) 24.0-24.9, adult: Secondary | ICD-10-CM | POA: Diagnosis not present

## 2020-11-18 DIAGNOSIS — Z1283 Encounter for screening for malignant neoplasm of skin: Secondary | ICD-10-CM | POA: Diagnosis not present

## 2020-11-18 DIAGNOSIS — D225 Melanocytic nevi of trunk: Secondary | ICD-10-CM | POA: Diagnosis not present

## 2020-11-18 DIAGNOSIS — R208 Other disturbances of skin sensation: Secondary | ICD-10-CM | POA: Diagnosis not present

## 2020-11-18 DIAGNOSIS — B351 Tinea unguium: Secondary | ICD-10-CM | POA: Diagnosis not present

## 2020-12-15 DIAGNOSIS — H40013 Open angle with borderline findings, low risk, bilateral: Secondary | ICD-10-CM | POA: Diagnosis not present

## 2021-04-01 ENCOUNTER — Other Ambulatory Visit: Payer: Self-pay

## 2021-04-01 ENCOUNTER — Encounter: Payer: Self-pay | Admitting: Podiatry

## 2021-04-01 ENCOUNTER — Ambulatory Visit (INDEPENDENT_AMBULATORY_CARE_PROVIDER_SITE_OTHER): Payer: BC Managed Care – PPO | Admitting: Podiatry

## 2021-04-01 DIAGNOSIS — L600 Ingrowing nail: Secondary | ICD-10-CM | POA: Diagnosis not present

## 2021-04-01 DIAGNOSIS — B351 Tinea unguium: Secondary | ICD-10-CM

## 2021-04-05 NOTE — Progress Notes (Signed)
Subjective:   Patient ID: Katrina Warren, female   DOB: 60 y.o.   MRN: [id number]   HPI Patient states she has thickening of her second nails on both feet and she has taken oral antifungal without relief and is wondering what can be done stating she is very active and walks 4 miles per day   Review of Systems  All other systems reviewed and are negative.       Objective:  Physical Exam Vitals and nursing note reviewed.  Constitutional:      Appearance: She is well-developed.  Pulmonary:     Effort: Pulmonary effort is normal.  Musculoskeletal:        General: Normal range of motion.  Skin:    General: Skin is warm.  Neurological:     Mental Status: She is alert.     Neurovascular status intact muscle strength adequate range of motion adequate with patient noted to have severely thickened second nails bilateral that are dystrophic and painful when pressed.  Patient has good digital perfusion well oriented x3 with mild digital deformity     Assessment:  Probability for digital deformity creating pressure on the nailbeds leading to the thickness that the patient is experiencing     Plan:  H&P reviewed condition and discussed that I do not see good treatment except for at one point in future nail removal which I educated her on.  Do not think this is a fungal problem is much as a trauma problem and do not see any other treatment options available currently.  Patient will decide on best options and will be seen back as needed

## 2021-06-21 ENCOUNTER — Other Ambulatory Visit: Payer: Self-pay | Admitting: Interventional Cardiology

## 2021-06-25 ENCOUNTER — Telehealth: Payer: Self-pay | Admitting: Interventional Cardiology

## 2021-06-25 MED ORDER — METOPROLOL SUCCINATE ER 25 MG PO TB24: 12.5000 mg | ORAL_TABLET | Freq: Every day | ORAL | 11 refills | Status: DC

## 2021-06-25 NOTE — Telephone Encounter (Signed)
*  STAT* If patient is at the pharmacy, call can be transferred to refill team.   1. Which medications need to be refilled? (please list name of each medication and dose if known)   metoprolol succinate (TOPROL-XL) 25 MG 24 hr tablet  2. Which pharmacy/location (including street and city if local pharmacy) is medication to be sent to? [id number] - [place], [place] - [address] [place] [address]  3. Do they need a 30 day or 90 day supply? 30 with refills  Patient is scheduled to see Dr. Katrinka Blazing 10/01/21

## 2021-07-26 DIAGNOSIS — Z13220 Encounter for screening for lipoid disorders: Secondary | ICD-10-CM | POA: Diagnosis not present

## 2021-07-26 DIAGNOSIS — Z131 Encounter for screening for diabetes mellitus: Secondary | ICD-10-CM | POA: Diagnosis not present

## 2021-07-28 DIAGNOSIS — Z0000 Encounter for general adult medical examination without abnormal findings: Secondary | ICD-10-CM | POA: Diagnosis not present

## 2021-09-23 ENCOUNTER — Other Ambulatory Visit: Payer: Self-pay

## 2021-09-23 ENCOUNTER — Ambulatory Visit (INDEPENDENT_AMBULATORY_CARE_PROVIDER_SITE_OTHER): Payer: BC Managed Care – PPO | Admitting: Podiatry

## 2021-09-23 ENCOUNTER — Encounter: Payer: Self-pay | Admitting: Podiatry

## 2021-09-23 DIAGNOSIS — L600 Ingrowing nail: Secondary | ICD-10-CM | POA: Diagnosis not present

## 2021-09-23 NOTE — Patient Instructions (Signed)

## 2021-09-24 ENCOUNTER — Ambulatory Visit: Payer: BC Managed Care – PPO | Admitting: Podiatry

## 2021-09-26 NOTE — Progress Notes (Signed)
Subjective:   Patient ID: Katrina Warren, female   DOB: 60 y.o.   MRN: [id number]   HPI Patient presents stating the second toenail on the right has continued to bother me I cannot trim it and I want it removed.  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  Patient read and then signed consent form understanding risk and today I infiltrated the second toe 60 mg Xylocaine Marcaine mixture sterile prep using sterile instrumentation remove the second nail exposed matrix and applied phenol for applications 30 seconds followed by alcohol lavage and sterile dressing.  Gave instructions on soaks and reappoint and encouraged her to call questions that may come up

## 2021-09-29 NOTE — Progress Notes (Signed)
Cardiology Office Note:    Date:  10/01/2021   ID:  Katrina Warren, DOB 04-Apr-1961, MRN [id number]  PCP:  Shon Hale, MD  Cardiologist:  Lesleigh Noe, MD   Referring MD: Shon Hale, *   Chief Complaint  Patient presents with   Follow-up    PSVT     History of Present Illness:    Katrina Warren is a 60 y.o. female with a hx of chest pain but negative prior cardiac work-up and H/O syncope. Switching from Dr. Tomie China.   Brief PSVT noted on monitor 06/23/2020. Beta blocker started with metoprolol XL 12.5 mg/day.  Low-dose beta-blocker therapy has eradicated episodes of SVT.  Patient is happy.  No medication side effects.  We discussed the mechanism and additional treatment options to include ablation if necessary.  Past Medical History:  Diagnosis Date   Osteopenia     Past Surgical History:  Procedure Laterality Date   CARPAL TUNNEL RELEASE     TONSILLECTOMY      Current Medications: Current Meds  Medication Sig   Ascorbic Acid (VITAMIN C) 100 MG tablet Take 1,000 mg by mouth daily.    b complex vitamins tablet Take 1 tablet by mouth daily.   cholecalciferol (VITAMIN D) 1000 units tablet Take 2,000 Units by mouth daily.    magnesium oxide (MAG-OX) 400 MG tablet Take 400 mg by mouth daily.   OVER THE COUNTER MEDICATION Take 1 tablet by mouth daily. lysene 500mg    Turmeric 500 MG TABS Take 1,000 mg by mouth daily.   [DISCONTINUED] metoprolol succinate (TOPROL-XL) 25 MG 24 hr tablet Take 0.5 tablets (12.5 mg total) by mouth daily. Please make overdue appt with Dr. before anymore refills. Thank you 1st attempt     Allergies:   Patient has no known allergies.   Social History   Socioeconomic History   Marital status: Single    Spouse name: Not on file   Number of children: Not on file   Years of education: Not on file   Highest education level: Not on file  Occupational History   Not on file  Tobacco Use   Smoking status:  Former   Smokeless tobacco: Never  Substance and Sexual Activity   Alcohol use: Yes    Alcohol/week: 6.0 standard drinks    Types: 6 Glasses of wine per week   Drug use: Never   Sexual activity: Not on file  Other Topics Concern   Not on file  Social History Narrative   Not on file   Social Determinants of Health   Financial Resource Strain: Not on file  Food Insecurity: Not on file  Transportation Needs: Not on file  Physical Activity: Not on file  Stress: Not on file  Social Connections: Not on file     Family History: The patient's family history is not on file. She was adopted.  ROS:   Please see the history of present illness.    No new data all other systems reviewed and are negative.  EKGs/Labs/Other Studies Reviewed:    The following studies were reviewed today: CARDIAC MONITOR 07/2020: Study Highlights  Normal sinus rhythm and sinus bradycardia PSVT at 180 bpm lasting 35 seconds with associated palpitations Occasional PVC;s and asymptomation 4 beat VT    EKG:  EKG sinus bradycardia with relatively short PR and possible delta wave.   Recent Labs: No results found for requested labs within last 8760 hours.  Recent Lipid Panel No  results found for: CHOL, TRIG, HDL, CHOLHDL, VLDL, LDLCALC, LDLDIRECT  Physical Exam:    VS:  BP 110/72   Pulse (!) 55   Ht 5' 0.5" (1.537 m)   Wt 121 lb 3.2 oz (55 kg)   SpO2 96%   BMI 23.28 kg/m     Wt Readings from Last 3 Encounters:  10/01/21 121 lb 3.2 oz (55 kg)  06/15/20 121 lb 6.4 oz (55.1 kg)  04/06/18 128 lb 12.8 oz (58.4 kg)     GEN: Healthy. No acute distress HEENT: Normal NECK: No JVD. LYMPHATICS: No lymphadenopathy CARDIAC: No murmur. RRR no gallop, or edema. VASCULAR:  Normal Pulses. No bruits. RESPIRATORY:  Clear to auscultation without rales, wheezing or rhonchi  ABDOMEN: Soft, non-tender, non-distended, No pulsatile mass, MUSCULOSKELETAL: No deformity  SKIN: Warm and dry NEUROLOGIC:  Alert and  oriented x 3 PSYCHIATRIC:  Normal affect   ASSESSMENT:    1. PSVT (paroxysmal supraventricular tachycardia) (HCC)   2. Syncope and collapse    PLAN:    In order of problems listed above:  Suppressed with with Toprol-XL.  She is having no symptoms.  She is happy with taking the low-dose beta-blocker.  We will follow-up in 1 year.  2D Doppler echocardiogram is ordered. Do not believe this is related at all to the patient's PSVT and when she has tachycardia-bradycardia syndrome.  We did not see any post tachycardia pauses or excessive bradycardia.   1 year follow-up   Medication Adjustments/Labs and Tests Ordered: Current medicines are reviewed at length with the patient today.  Concerns regarding medicines are outlined above.  Orders Placed This Encounter  Procedures   EKG 12-Lead   ECHOCARDIOGRAM COMPLETE    Meds ordered this encounter  Medications   metoprolol succinate (TOPROL-XL) 25 MG 24 hr tablet    Sig: Take 0.5 tablets (12.5 mg total) by mouth daily.    Dispense:  45 tablet    Refill:  3     Patient Instructions  Medication Instructions:  Your physician recommends that you continue on your current medications as directed. Please refer to the Current Medication list given to you today.  *If you need a refill on your cardiac medications before your next appointment, please call your pharmacy*   Lab Work: None If you have labs (blood work) drawn today and your tests are completely normal, you will receive your results only by: MyChart Message (if you have MyChart) OR A paper copy in the mail If you have any lab test that is abnormal or we need to change your treatment, we will call you to review the results.   Testing/Procedures: Your physician has requested that you have an echocardiogram anytime between now and your next appointment with Dr. Katrinka Blazing. Echocardiography is a painless test that uses sound waves to create images of your heart. It provides your doctor  with information about the size and shape of your heart and how well your heart's chambers and valves are working. This procedure takes approximately one hour. There are no restrictions for this procedure.    Follow-Up: At [facility], you and your health needs are our priority.  As part of our continuing mission to provide you with exceptional heart care, we have created designated Provider Care Teams.  These Care Teams include your primary Cardiologist (physician) and Advanced Practice Providers (APPs -  Physician Assistants and Nurse Practitioners) who all work together to provide you with the care you need, when you need it.  We  recommend signing up for the patient portal called "MyChart".  Sign up information is provided on this After Visit Summary.  MyChart is used to connect with patients for Virtual Visits (Telemedicine).  Patients are able to view lab/test results, encounter notes, upcoming appointments, etc.  Non-urgent messages can be sent to your provider as well.   To learn more about what you can do with MyChart, go to [email or web address].    Your next appointment:   1 year(s)  The format for your next appointment:   In Person  Provider:   You may see Lesleigh Noe, MD or one of the following Advanced Practice Providers on your designated Care Team:   Nada Boozer, NP   Other Instructions     Signed, Lesleigh Noe, MD  10/01/2021 8:22 AM    [facility]

## 2021-10-01 ENCOUNTER — Encounter: Payer: Self-pay | Admitting: Interventional Cardiology

## 2021-10-01 ENCOUNTER — Ambulatory Visit (INDEPENDENT_AMBULATORY_CARE_PROVIDER_SITE_OTHER): Payer: BC Managed Care – PPO | Admitting: Interventional Cardiology

## 2021-10-01 ENCOUNTER — Other Ambulatory Visit: Payer: Self-pay

## 2021-10-01 VITALS — BP 110/72 | HR 55 | Ht 60.5 in | Wt 121.2 lb

## 2021-10-01 DIAGNOSIS — R55 Syncope and collapse: Secondary | ICD-10-CM

## 2021-10-01 DIAGNOSIS — R079 Chest pain, unspecified: Secondary | ICD-10-CM

## 2021-10-01 DIAGNOSIS — I471 Supraventricular tachycardia: Secondary | ICD-10-CM

## 2021-10-01 MED ORDER — METOPROLOL SUCCINATE ER 25 MG PO TB24: 12.5000 mg | ORAL_TABLET | Freq: Every day | ORAL | 3 refills | Status: DC

## 2021-10-01 NOTE — Patient Instructions (Addendum)
Medication Instructions:  Your physician recommends that you continue on your current medications as directed. Please refer to the Current Medication list given to you today.  *If you need a refill on your cardiac medications before your next appointment, please call your pharmacy*   Lab Work: None If you have labs (blood work) drawn today and your tests are completely normal, you will receive your results only by: MyChart Message (if you have MyChart) OR A paper copy in the mail If you have any lab test that is abnormal or we need to change your treatment, we will call you to review the results.   Testing/Procedures: Your physician has requested that you have an echocardiogram anytime between now and your next appointment with Dr. Katrinka Blazing. Echocardiography is a painless test that uses sound waves to create images of your heart. It provides your doctor with information about the size and shape of your heart and how well your heart's chambers and valves are working. This procedure takes approximately one hour. There are no restrictions for this procedure.    Follow-Up: At [facility], you and your health needs are our priority.  As part of our continuing mission to provide you with exceptional heart care, we have created designated Provider Care Teams.  These Care Teams include your primary Cardiologist (physician) and Advanced Practice Providers (APPs -  Physician Assistants and Nurse Practitioners) who all work together to provide you with the care you need, when you need it.  We recommend signing up for the patient portal called "MyChart".  Sign up information is provided on this After Visit Summary.  MyChart is used to connect with patients for Virtual Visits (Telemedicine).  Patients are able to view lab/test results, encounter notes, upcoming appointments, etc.  Non-urgent messages can be sent to your provider as well.   To learn more about what you can do with MyChart, go to  [email or web address].    Your next appointment:   1 year(s)  The format for your next appointment:   In Person  Provider:   You may see Lesleigh Noe, MD or one of the following Advanced Practice Providers on your designated Care Team:   Nada Boozer, NP   Other Instructions

## 2021-11-01 ENCOUNTER — Ambulatory Visit (HOSPITAL_COMMUNITY): Payer: BC Managed Care – PPO | Attending: Internal Medicine

## 2021-11-01 ENCOUNTER — Other Ambulatory Visit: Payer: Self-pay

## 2021-11-01 DIAGNOSIS — I471 Supraventricular tachycardia: Secondary | ICD-10-CM | POA: Insufficient documentation

## 2021-11-01 LAB — ECHOCARDIOGRAM COMPLETE
Area-P 1/2: 4.6 cm2
S' Lateral: 2.9 cm

## 2021-11-07 DIAGNOSIS — M25562 Pain in left knee: Secondary | ICD-10-CM | POA: Diagnosis not present

## 2021-11-08 DIAGNOSIS — Z1231 Encounter for screening mammogram for malignant neoplasm of breast: Secondary | ICD-10-CM | POA: Diagnosis not present

## 2021-11-08 DIAGNOSIS — Z6823 Body mass index (BMI) 23.0-23.9, adult: Secondary | ICD-10-CM | POA: Diagnosis not present

## 2021-11-08 DIAGNOSIS — M25562 Pain in left knee: Secondary | ICD-10-CM | POA: Diagnosis not present

## 2021-11-08 DIAGNOSIS — M170 Bilateral primary osteoarthritis of knee: Secondary | ICD-10-CM | POA: Diagnosis not present

## 2021-11-08 DIAGNOSIS — Z01419 Encounter for gynecological examination (general) (routine) without abnormal findings: Secondary | ICD-10-CM | POA: Diagnosis not present

## 2021-11-24 DIAGNOSIS — R87810 Cervical high risk human papillomavirus (HPV) DNA test positive: Secondary | ICD-10-CM | POA: Diagnosis not present

## 2021-11-24 DIAGNOSIS — R87610 Atypical squamous cells of undetermined significance on cytologic smear of cervix (ASC-US): Secondary | ICD-10-CM | POA: Diagnosis not present

## 2021-11-25 DIAGNOSIS — N870 Mild cervical dysplasia: Secondary | ICD-10-CM | POA: Diagnosis not present

## 2021-12-16 DIAGNOSIS — H40013 Open angle with borderline findings, low risk, bilateral: Secondary | ICD-10-CM | POA: Diagnosis not present

## 2021-12-30 DIAGNOSIS — N871 Moderate cervical dysplasia: Secondary | ICD-10-CM | POA: Diagnosis not present

## 2021-12-30 DIAGNOSIS — N879 Dysplasia of cervix uteri, unspecified: Secondary | ICD-10-CM | POA: Diagnosis not present

## 2022-01-11 DIAGNOSIS — U071 COVID-19: Secondary | ICD-10-CM | POA: Diagnosis not present

## 2022-01-11 DIAGNOSIS — R0981 Nasal congestion: Secondary | ICD-10-CM | POA: Diagnosis not present

## 2022-05-30 DIAGNOSIS — Z124 Encounter for screening for malignant neoplasm of cervix: Secondary | ICD-10-CM | POA: Diagnosis not present

## 2022-05-30 DIAGNOSIS — R87610 Atypical squamous cells of undetermined significance on cytologic smear of cervix (ASC-US): Secondary | ICD-10-CM | POA: Diagnosis not present

## 2022-05-30 DIAGNOSIS — R87810 Cervical high risk human papillomavirus (HPV) DNA test positive: Secondary | ICD-10-CM | POA: Diagnosis not present

## 2022-05-30 DIAGNOSIS — Z1151 Encounter for screening for human papillomavirus (HPV): Secondary | ICD-10-CM | POA: Diagnosis not present

## 2022-06-15 DIAGNOSIS — H40013 Open angle with borderline findings, low risk, bilateral: Secondary | ICD-10-CM | POA: Diagnosis not present

## 2022-06-22 DIAGNOSIS — Z1151 Encounter for screening for human papillomavirus (HPV): Secondary | ICD-10-CM | POA: Diagnosis not present

## 2022-06-22 DIAGNOSIS — Z124 Encounter for screening for malignant neoplasm of cervix: Secondary | ICD-10-CM | POA: Diagnosis not present

## 2022-08-02 DIAGNOSIS — E559 Vitamin D deficiency, unspecified: Secondary | ICD-10-CM | POA: Diagnosis not present

## 2022-08-02 DIAGNOSIS — Z79899 Other long term (current) drug therapy: Secondary | ICD-10-CM | POA: Diagnosis not present

## 2022-08-02 DIAGNOSIS — R7301 Impaired fasting glucose: Secondary | ICD-10-CM | POA: Diagnosis not present

## 2022-08-02 DIAGNOSIS — E7800 Pure hypercholesterolemia, unspecified: Secondary | ICD-10-CM | POA: Diagnosis not present

## 2022-08-08 DIAGNOSIS — Z0000 Encounter for general adult medical examination without abnormal findings: Secondary | ICD-10-CM | POA: Diagnosis not present

## 2022-08-12 ENCOUNTER — Ambulatory Visit (INDEPENDENT_AMBULATORY_CARE_PROVIDER_SITE_OTHER): Payer: BC Managed Care – PPO | Admitting: Podiatry

## 2022-08-12 ENCOUNTER — Encounter: Payer: Self-pay | Admitting: Podiatry

## 2022-08-12 DIAGNOSIS — L600 Ingrowing nail: Secondary | ICD-10-CM | POA: Diagnosis not present

## 2022-08-12 NOTE — Patient Instructions (Signed)

## 2022-08-13 NOTE — Progress Notes (Signed)
Subjective:   Patient ID: Katrina Warren, female   DOB: 61 y.o.   MRN: [id number]   HPI Patient states right second nail has come back and its been thick and irritative with some distal callus formation   ROS      Objective:  Physical Exam  Neurovascular status intact with a nail that for 1 reason other has regrowth with with what appears to be the medial border shutdown but the rest of it regrown with thick dystrophic nail bed moderately tender     Assessment:  Abnormal nail growth right second nailbed painful     Plan:  Reviewed condition recommended that this be done again and I will not use a diluted agent.  This should take care of the problem and I cannot make guarantees and may eventually have to be stitched closed and today I went ahead allowed her to sign consent form I then anesthetized the right second toe 60 mg like Marcaine mixture sterile prep done using sterile instrumentation removed the nail exposed matrix applied phenol 3 applications and then applied sterile dressing instructed on leaving on for 24 hours take it off earlier if any throbbing were to occur.  Encouraged her to call questions concerns which may arise

## 2022-08-29 ENCOUNTER — Encounter: Payer: Self-pay | Admitting: Podiatry

## 2022-10-18 DIAGNOSIS — L439 Lichen planus, unspecified: Secondary | ICD-10-CM | POA: Diagnosis not present

## 2022-10-18 DIAGNOSIS — D485 Neoplasm of uncertain behavior of skin: Secondary | ICD-10-CM | POA: Diagnosis not present

## 2022-10-18 DIAGNOSIS — L578 Other skin changes due to chronic exposure to nonionizing radiation: Secondary | ICD-10-CM | POA: Diagnosis not present

## 2022-10-18 DIAGNOSIS — L814 Other melanin hyperpigmentation: Secondary | ICD-10-CM | POA: Diagnosis not present

## 2022-10-18 DIAGNOSIS — D1801 Hemangioma of skin and subcutaneous tissue: Secondary | ICD-10-CM | POA: Diagnosis not present

## 2022-10-18 DIAGNOSIS — L570 Actinic keratosis: Secondary | ICD-10-CM | POA: Diagnosis not present

## 2022-10-18 DIAGNOSIS — L821 Other seborrheic keratosis: Secondary | ICD-10-CM | POA: Diagnosis not present

## 2022-10-31 ENCOUNTER — Other Ambulatory Visit: Payer: Self-pay | Admitting: Interventional Cardiology

## 2022-11-03 DIAGNOSIS — L570 Actinic keratosis: Secondary | ICD-10-CM | POA: Diagnosis not present

## 2022-11-03 DIAGNOSIS — L821 Other seborrheic keratosis: Secondary | ICD-10-CM | POA: Diagnosis not present

## 2022-11-14 DIAGNOSIS — Z01419 Encounter for gynecological examination (general) (routine) without abnormal findings: Secondary | ICD-10-CM | POA: Diagnosis not present

## 2022-11-14 DIAGNOSIS — Z1151 Encounter for screening for human papillomavirus (HPV): Secondary | ICD-10-CM | POA: Diagnosis not present

## 2022-11-14 DIAGNOSIS — Z6823 Body mass index (BMI) 23.0-23.9, adult: Secondary | ICD-10-CM | POA: Diagnosis not present

## 2022-11-14 DIAGNOSIS — Z124 Encounter for screening for malignant neoplasm of cervix: Secondary | ICD-10-CM | POA: Diagnosis not present

## 2022-11-14 DIAGNOSIS — Z1231 Encounter for screening mammogram for malignant neoplasm of breast: Secondary | ICD-10-CM | POA: Diagnosis not present

## 2022-11-15 NOTE — Progress Notes (Unsigned)
Cardiology Office Note:    Date:  11/17/2022   ID:  Katrina Warren, DOB 1961/08/30, MRN [id number]  PCP:  Shon Hale, MD  Cardiologist:  Lesleigh Noe, MD   Referring MD: Shon Hale, *   Chief Complaint  Patient presents with   Irregular Heart Beat    WPW type A    History of Present Illness:    Katrina Warren is a 61 y.o. female with a hx of chest pain (negative ischemic workup), nonsustained SVT, and H/O syncope. Switching from Dr. Tomie China.    Normal palpitations.  No prolonged tachycardia.  No side effects to metoprolol.  Currently taking 12.5 mg of metoprolol succinate.  Has never had prolonged tachycardia, syncope, chest pain, or dizziness.  Prior to starting metoprolol perhaps she was having half minute to 1 minute episode of SVT episodes.  Had a syncopal episode in 2021 associated with premonitory symptoms of fainting and felt to have been vasovagal.  Past Medical History:  Diagnosis Date   Osteopenia     Past Surgical History:  Procedure Laterality Date   CARPAL TUNNEL RELEASE     TONSILLECTOMY      Current Medications: Current Meds  Medication Sig   Ascorbic Acid (VITAMIN C) 100 MG tablet Take 1,000 mg by mouth daily.    aspirin EC 81 MG tablet Take 81 mg by mouth daily. Swallow whole.   b complex vitamins tablet Take 1 tablet by mouth daily.   cholecalciferol (VITAMIN D) 1000 units tablet Take 2,000 Units by mouth daily.    clonazePAM (KLONOPIN) 0.5 MG tablet Take 0.5 mg by mouth as needed for anxiety.   Lysine 500 MG TABS Take 1 tablet by mouth daily at 6 (six) AM.   magnesium oxide (MAG-OX) 400 MG tablet Take 400 mg by mouth daily.   OVER THE COUNTER MEDICATION Take 1 tablet by mouth daily. lysene 500mg    Rhubarb (ESTROVEN COMPLETE) 4 MG TABS Take 1 tablet by mouth daily at 6 (six) AM.   [DISCONTINUED] metoprolol succinate (TOPROL-XL) 25 MG 24 hr tablet Take 0.5 tablets (12.5 mg total) by mouth daily. Please keep scheduled  appointment for future refills. Thank you.     Allergies:   Patient has no known allergies.   Social History   Socioeconomic History   Marital status: Single    Spouse name: Not on file   Number of children: Not on file   Years of education: Not on file   Highest education level: Not on file  Occupational History   Not on file  Tobacco Use   Smoking status: Former   Smokeless tobacco: Never  Substance and Sexual Activity   Alcohol use: Yes    Alcohol/week: 6.0 standard drinks of alcohol    Types: 6 Glasses of wine per week   Drug use: Never   Sexual activity: Not on file  Other Topics Concern   Not on file  Social History Narrative   Not on file   Social Determinants of Health   Financial Resource Strain: Not on file  Food Insecurity: Not on file  Transportation Needs: Not on file  Physical Activity: Not on file  Stress: Not on file  Social Connections: Not on file     Family History: The patient's family history is not on file. She was adopted.  ROS:   Please see the history of present illness.    No complaints all other systems reviewed and are negative.  EKGs/Labs/Other  Studies Reviewed:    The following studies were reviewed today: No new data  2021 Continuous Monitor Study Highlights  Normal sinus rhythm and sinus bradycardia PSVT at 180 bpm lasting 35 seconds with associated palpitations Occasional PVC;s and asymptomation 4 beat VT   EKG:  EKG sinus rhythm at 67 bpm, short PR interval, WPW type II, PR interval relatively short with obvious delta wave.  Recent Labs: No results found for requested labs within last 365 days.  Recent Lipid Panel No results found for: "CHOL", "TRIG", "HDL", "CHOLHDL", "VLDL", "LDLCALC", "LDLDIRECT"  Physical Exam:    VS:  BP 108/68   Pulse 67   Ht 5' 0.5" (1.537 m)   Wt 119 lb 9.6 oz (54.3 kg)   SpO2 98%   BMI 22.97 kg/m     Wt Readings from Last 3 Encounters:  11/17/22 119 lb 9.6 oz (54.3 kg)  10/01/21  121 lb 3.2 oz (55 kg)  06/15/20 121 lb 6.4 oz (55.1 kg)     GEN: Healthy appearing. No acute distress HEENT: Normal NECK: No JVD. LYMPHATICS: No lymphadenopathy CARDIAC: No murmur. RRR no gallop, or edema. VASCULAR:  Normal Pulses. No bruits. RESPIRATORY:  Clear to auscultation without rales, wheezing or rhonchi  ABDOMEN: Soft, non-tender, non-distended, No pulsatile mass, MUSCULOSKELETAL: No deformity  SKIN: Warm and dry NEUROLOGIC:  Alert and oriented x 3 PSYCHIATRIC:  Normal affect   ASSESSMENT:    1. PSVT (paroxysmal supraventricular tachycardia)   2. Wolff-Parkinson-White (WPW) syndrome, type A   3. Vasovagal syncope    PLAN:    In order of problems listed above:  Likely related to WPW and accessory pathway.  WPW type a pattern noted for the first time on EKG done today.  Since starting beta-blocker therapy, the patient has not had any palpitations. Noted above.  I explained that she was born with this predisposition and that there can be a cure if palpitations become an issue uncontrollable with medication.  She is on beta-blocker therapy now and seems to be under good control.  Theoretically, beta-blocker therapy could cause antegrade conduction down the accessory pathway and rapid rates. 1 episode of vasovagal syncope associated with an afternoon of alcohol intake.  Watchful waiting at this time.  If recurrent syncope, report immediately.   Medication Adjustments/Labs and Tests Ordered: Current medicines are reviewed at length with the patient today.  Concerns regarding medicines are outlined above.  Orders Placed This Encounter  Procedures   EKG 12-Lead   Meds ordered this encounter  Medications   metoprolol succinate (TOPROL-XL) 25 MG 24 hr tablet    Sig: Take 0.5 tablets (12.5 mg total) by mouth daily. Please keep scheduled appointment for future refills. Thank you.    Dispense:  45 tablet    Refill:  3    Patient Instructions  Medication Instructions:   Your physician recommends that you continue on your current medications as directed. Please refer to the Current Medication list given to you today.  *If you need a refill on your cardiac medications before your next appointment, please call your pharmacy*  Follow-Up: At [facility], you and your health needs are our priority.  As part of our continuing mission to provide you with exceptional heart care, we have created designated Provider Care Teams.  These Care Teams include your primary Cardiologist (physician) and Advanced Practice Providers (APPs -  Physician Assistants and Nurse Practitioners) who all work together to provide you with the care you need, when you need  it.  Your next appointment:   1 year(s)  The format for your next appointment:   In Person  Provider:   Donato Schultz, MD or Laurance Flatten, MD or Riley Lam, MD  Important Information About Sugar         Signed, Lesleigh Noe, MD  11/17/2022 9:19 AM    [facility]

## 2022-11-16 ENCOUNTER — Other Ambulatory Visit: Payer: Self-pay | Admitting: Obstetrics and Gynecology

## 2022-11-16 DIAGNOSIS — R928 Other abnormal and inconclusive findings on diagnostic imaging of breast: Secondary | ICD-10-CM

## 2022-11-17 ENCOUNTER — Ambulatory Visit: Payer: BC Managed Care – PPO | Attending: Interventional Cardiology | Admitting: Interventional Cardiology

## 2022-11-17 ENCOUNTER — Encounter: Payer: Self-pay | Admitting: Interventional Cardiology

## 2022-11-17 VITALS — BP 108/68 | HR 67 | Ht 60.5 in | Wt 119.6 lb

## 2022-11-17 DIAGNOSIS — R55 Syncope and collapse: Secondary | ICD-10-CM | POA: Diagnosis not present

## 2022-11-17 DIAGNOSIS — I456 Pre-excitation syndrome: Secondary | ICD-10-CM | POA: Diagnosis not present

## 2022-11-17 DIAGNOSIS — I471 Supraventricular tachycardia, unspecified: Secondary | ICD-10-CM | POA: Diagnosis not present

## 2022-11-17 MED ORDER — METOPROLOL SUCCINATE ER 25 MG PO TB24: 12.5000 mg | ORAL_TABLET | Freq: Every day | ORAL | 3 refills | Status: DC

## 2022-11-17 NOTE — Patient Instructions (Signed)
Medication Instructions:  Your physician recommends that you continue on your current medications as directed. Please refer to the Current Medication list given to you today.  *If you need a refill on your cardiac medications before your next appointment, please call your pharmacy*  Follow-Up: At [facility], you and your health needs are our priority.  As part of our continuing mission to provide you with exceptional heart care, we have created designated Provider Care Teams.  These Care Teams include your primary Cardiologist (physician) and Advanced Practice Providers (APPs -  Physician Assistants and Nurse Practitioners) who all work together to provide you with the care you need, when you need it.  Your next appointment:   1 year(s)  The format for your next appointment:   In Person  Provider:   Donato Schultz, MD or Laurance Flatten, MD or Riley Lam, MD  Important Information About Sugar

## 2022-11-25 ENCOUNTER — Ambulatory Visit
Admission: RE | Admit: 2022-11-25 | Discharge: 2022-11-25 | Disposition: A | Discharge status: Home / Self Care | Payer: BC Managed Care – PPO | Source: Ambulatory Visit | Attending: Obstetrics and Gynecology | Admitting: Obstetrics and Gynecology

## 2022-11-25 DIAGNOSIS — R928 Other abnormal and inconclusive findings on diagnostic imaging of breast: Secondary | ICD-10-CM

## 2022-11-25 DIAGNOSIS — R921 Mammographic calcification found on diagnostic imaging of breast: Secondary | ICD-10-CM | POA: Diagnosis not present

## 2022-11-29 ENCOUNTER — Other Ambulatory Visit: Payer: Self-pay | Admitting: Obstetrics and Gynecology

## 2022-11-29 DIAGNOSIS — R921 Mammographic calcification found on diagnostic imaging of breast: Secondary | ICD-10-CM

## 2022-12-06 ENCOUNTER — Ambulatory Visit
Admission: RE | Admit: 2022-12-06 | Discharge: 2022-12-06 | Disposition: A | Discharge status: Home / Self Care | Payer: BC Managed Care – PPO | Source: Ambulatory Visit | Attending: Obstetrics and Gynecology | Admitting: Obstetrics and Gynecology

## 2022-12-06 DIAGNOSIS — R921 Mammographic calcification found on diagnostic imaging of breast: Secondary | ICD-10-CM

## 2022-12-06 DIAGNOSIS — R920 Mammographic microcalcification found on diagnostic imaging of breast: Secondary | ICD-10-CM | POA: Diagnosis not present

## 2022-12-06 DIAGNOSIS — N6012 Diffuse cystic mastopathy of left breast: Secondary | ICD-10-CM | POA: Diagnosis not present

## 2022-12-06 HISTORY — PX: BREAST BIOPSY: SHX20

## 2022-12-08 DIAGNOSIS — Z13820 Encounter for screening for osteoporosis: Secondary | ICD-10-CM | POA: Diagnosis not present

## 2022-12-15 DIAGNOSIS — H40013 Open angle with borderline findings, low risk, bilateral: Secondary | ICD-10-CM | POA: Diagnosis not present

## 2023-05-18 ENCOUNTER — Telehealth: Payer: Self-pay | Admitting: Cardiology

## 2023-05-18 NOTE — Telephone Encounter (Signed)
  Pt c/o of Chest Pain: STAT if CP now or developed within 24 hours  1. Are you having CP right now? No   2. Are you experiencing any other symptoms (ex. SOB, nausea, vomiting, sweating)? Just pain   3. How long have you been experiencing CP? Couple of weeks   4. Is your CP continuous or coming and going? Coming and going   5. Have you taken Nitroglycerin? No   Pt said, for the last couple of weeks she's been experiencing pain on her chest. It comes and goes and lasted for few seconds. She wanted to know if she needs to be seen at the office. Previous pt of Dr. Katrinka Blazing

## 2023-05-18 NOTE — Telephone Encounter (Signed)
Spoke with patient who reports intermittent chest pain for last couple of weeks. She denies SOB, Palpitations, N/V.  Scheduled appointment with APP and provided education on when to seek emergency care.  Patient verbalized understanding and had no questions.

## 2023-05-22 DIAGNOSIS — N879 Dysplasia of cervix uteri, unspecified: Secondary | ICD-10-CM | POA: Diagnosis not present

## 2023-05-22 DIAGNOSIS — R87615 Unsatisfactory cytologic smear of cervix: Secondary | ICD-10-CM | POA: Diagnosis not present

## 2023-05-22 DIAGNOSIS — Z1151 Encounter for screening for human papillomavirus (HPV): Secondary | ICD-10-CM | POA: Diagnosis not present

## 2023-05-22 DIAGNOSIS — Z124 Encounter for screening for malignant neoplasm of cervix: Secondary | ICD-10-CM | POA: Diagnosis not present

## 2023-05-30 NOTE — Progress Notes (Unsigned)
Cardiology Office Note:    Date:  05/31/2023   ID:  Katrina Warren, DOB [birth year], MRN [id number]  PCP:  Shon Hale, MD  [facility] Providers Cardiologist:  Lesleigh Noe, MD (Inactive)    Referring MD: Shon Hale, *   Chief Complaint:  No chief complaint on file.    Patient Profile:  Chest pain Non-sustained SVT 30 sec-1 min episodes Syncope In 2021, suspected vasovagal  Cardiac Studies & Procedures     STRESS TESTS  ECHOCARDIOGRAM STRESS TEST 05/11/2018  Narrative *[facility]* [address] [place], [place] [address] [phone]  ------------------------------------------------------------------- Stress Echocardiography  Patient:    Katrina Warren, Katrina Warren MR #:       [id number] Study Date: 05/11/2018 Gender:     F Age:        62 Height:     153.7 cm Weight:     58.4 kg BSA:        1.59 m^2 Pt. Status: Room:  Luisa Dago [id number] ATTENDING    Belva Crome, MD ORDERING     Belva Crome, MD REFERRING    Belva Crome, MD PERFORMING   [facility] SONOGRAPHER  Jimmy Reel, RDCS  cc:  -------------------------------------------------------------------  ------------------------------------------------------------------- Indications:      Chest pain 786.51.  ------------------------------------------------------------------- History:   PMH:  No prior cardiac history.  ------------------------------------------------------------------- Study Conclusions  - Stress ECG conclusions: There were no stress arrhythmias or conduction abnormalities. The stress ECG was negative for ischemia. - Staged echo: There was no echocardiographic evidence for stress-induced ischemia.  Impressions:  - Good exercise tolerance. No chest pain during the exercise. NO evidence of ischemia base on ecg or Echo criteria. Overall NEGATIVE stress echo for exercise induced  ischemia.  ------------------------------------------------------------------- Study data:   Study status:  Routine.  Consent:  The risks, benefits, and alternatives to the procedure were explained to the patient and informed consent was obtained.  Procedure:  The patient reported no pain pre or post test. Initial setup. The patient was brought to the laboratory. A baseline ECG was recorded. Surface ECG leads and automatic cuff blood pressure measurements were monitored. Treadmill exercise testing was performed using the Bruce protocol. The patient exercised for 10 min 2 sec, to a maximal work rate of 13.4 mets. Exercise was terminated due to achievement of target heart rate. The patient was positioned for image acquisition and recovery monitoring. Transthoracic stress echocardiography for chest pain evaluation. Image quality was good. Images were captured at baseline and peak exercise.  Study completion:  The patient tolerated the procedure well. There were no complications. Bruce protocol. Stress echocardiography.  Birthdate:  Patient birthdate: Dec 12, 1961.  Age:  Patient is 62 yr old.  Sex:  Gender: female.    BMI: 24.7 kg/m^2.  Blood pressure:     112/72  Patient status:  Outpatient.  Study date:  Study date: 05/11/2018. Study time: 02:10 PM.  -------------------------------------------------------------------  ------------------------------------------------------------------- Mitral valve:   Doppler:     Peak gradient (D): 3 mm Hg.  ------------------------------------------------------------------- Baseline ECG:  Normal.  ------------------------------------------------------------------- Stress protocol:  +--------+---------------+---+-----------+--------+ !Stage   !Time into phase!HR !BP (mmHg)  !Symptoms! +--------+---------------+---+-----------+--------+ !Baseline!---------------!77 !118/78 (91)!None    ! +--------+---------------+---+-----------+--------+ !Stage 1  !---------------!010!272/53 (86)!--------! +--------+---------------+---+-----------+--------+ !Stage 2 !---------------!664!403/47 (85)!--------! +--------+---------------+---+-----------+--------+ !Stage 3 !---------------!141!151/68 (96)!--------! +--------+---------------+---+-----------+--------+ !Stage 4 !---------------!151!-----------!--------! +--------+---------------+---+-----------+--------+ !Recovery!3 min 28 sec   !83 !130/73 (92)!--------! +--------+---------------+---+-----------+--------+  ------------------------------------------------------------------- Stress results:   Maximal heart rate during stress  was 151 bpm (94% of maximal predicted heart rate). The maximal predicted heart rate was 161 bpm.The target heart rate was achieved. The heart rate response to stress was normal. There was a normal resting blood pressure with an appropriate response to stress. The rate-pressure product for the peak heart rate and blood pressure was 16109 mm Hg/min.  The patient experienced no chest pain during stress.  ------------------------------------------------------------------- Stress ECG:  There were no stress arrhythmias or conduction abnormalities.  The stress ECG was negative for ischemia.  ------------------------------------------------------------------- Baseline:  - The estimated LV ejection fraction was 55%. - Normal wall motion; no LV regional wall motion abnormalities.  - The estimated LV ejection fraction was 60%. - Normal wall motion; no LV regional wall motion abnormalities.  ------------------------------------------------------------------- Stress echo results:     Left ventricular ejection fraction was normal at rest and with stress. There was no echocardiographic evidence for stress-induced ischemia.  ------------------------------------------------------------------- Measurements  Left ventricle                         Value        Reference LV  ID, ED, PLAX chordal                45.3  mm     43 - 52 LV ID, ES, PLAX chordal                32.6  mm     23 - 38 LV fx shortening, PLAX chordal (L)     28    %      >=29 LV PW thickness, ED                    9.76  mm     --------- IVS/LV PW ratio, ED                    0.87         <=1.3 LV e&', lateral                         10.1  cm/s   --------- LV E/e&', lateral                       8.41         --------- LV e&', medial                          9.03  cm/s   --------- LV E/e&', medial                        9.4          --------- LV e&', average                         9.57  cm/s   --------- LV E/e&', average                       8.88         ---------  Ventricular septum                     Value        Reference IVS thickness, ED  8.52  mm     ---------  LVOT                                   Value        Reference LVOT ID, S                             18    mm     --------- LVOT area                              2.54  cm^2   ---------  Aorta                                  Value        Reference Aortic root ID, ED                     31    mm     --------- Ascending aorta ID, A-P, S             28    mm     ---------  Left atrium                            Value        Reference LA ID, A-P, ES                         34    mm     --------- LA ID/bsa, A-P                         2.14  cm/m^2 <=2.2 LA volume, S                           44.4  ml     --------- LA volume/bsa, S                       27.9  ml/m^2 --------- LA volume, ES, 1-p A4C                 43.7  ml     --------- LA volume/bsa, ES, 1-p A4C             27.5  ml/m^2 --------- LA volume, ES, 1-p A2C                 45    ml     --------- LA volume/bsa, ES, 1-p A2C             28.3  ml/m^2 ---------  Mitral valve                           Value        Reference Mitral E-wave peak velocity            84.9  cm/s   --------- Mitral A-wave peak velocity            59.2  cm/s    --------- Mitral deceleration time       (  H)     349   ms     150 - 230 Mitral peak gradient, D                3     mm Hg  --------- Mitral E/A ratio, peak                 1.4          ---------  Tricuspid valve                        Value        Reference Tricuspid regurg peak velocity         236   cm/s   --------- Tricuspid peak RV-RA gradient          22    mm Hg  ---------  Legend: (L)  and  (H)  mark values outside specified reference range.  ------------------------------------------------------------------- Prepared and Electronically Authenticated by  Gypsy Balsam, MD 2019-05-24T17:19:02   ECHOCARDIOGRAM  ECHOCARDIOGRAM COMPLETE 11/01/2021  Narrative ECHOCARDIOGRAM REPORT    Patient Name:   Katrina Warren Date of Exam: 11/01/2021 Medical Rec #:  [id number]         Height:       60.5 in Accession #:    [id number]        Weight:       121.2 lb Date of Birth:  Nov 01, 1961          BSA:          1.518 m Patient Age:    60 years          BP:           110/72 mmHg Patient Gender: F                 HR:           59 bpm. Exam Location:  [address]  Procedure: 2D Echo, 3D Echo, Cardiac Doppler, Color Doppler and Strain Analysis  Indications:    I47.1 PSVT  History:        Patient has prior history of Echocardiogram examinations, most recent 05/11/2018.  Sonographer:    Daphine Deutscher RDCS Referring Phys: [id number] Barry Dienes [facility]  IMPRESSIONS   1. Left ventricular ejection fraction, by estimation, is 60 to 65%. Left ventricular ejection fraction by 3D volume is 62 %. The left ventricle has normal function. The left ventricle has no regional wall motion abnormalities. Left ventricular diastolic parameters were normal. The average left ventricular global longitudinal strain is -25.5 %. The global longitudinal strain is normal. 2. Right ventricular systolic function is normal. The right ventricular size is normal. There is normal pulmonary artery systolic  pressure. The estimated right ventricular systolic pressure is 27.8 mmHg. 3. No prolapse. The mitral valve is abnormal, demonstrating bileaflet thickening. Trivial central mitral valve regurgitation. 4. Tricsupid leaflet thickening is noted. 5. The aortic valve is tricuspid. Aortic valve regurgitation is not visualized. 6. The inferior vena cava is normal in size with greater than 50% respiratory variability, suggesting right atrial pressure of 3 mmHg.  Comparison(s): Changes from prior study are noted. 05/11/2018 (stress echo) - LVEF at rest was 55%.  FINDINGS Left Ventricle: Left ventricular ejection fraction, by estimation, is 60 to 65%. Left ventricular ejection fraction by 3D volume is 62 %. The left ventricle has normal function. The left ventricle has no regional wall motion abnormalities. The average left ventricular global longitudinal  strain is -25.5 %. The global longitudinal strain is normal. The left ventricular internal cavity size was normal in size. There is no left ventricular hypertrophy. Left ventricular diastolic parameters were normal.  Right Ventricle: The right ventricular size is normal. No increase in right ventricular wall thickness. Right ventricular systolic function is normal. There is normal pulmonary artery systolic pressure. The tricuspid regurgitant velocity is 2.49 m/s, and with an assumed right atrial pressure of 3 mmHg, the estimated right ventricular systolic pressure is 27.8 mmHg.  Left Atrium: Left atrial size was normal in size.  Right Atrium: Right atrial size was normal in size.  Pericardium: There is no evidence of pericardial effusion.  Mitral Valve: No prolapse. The mitral valve is abnormal. There is mild thickening of the anterior and posterior mitral valve leaflet(s). Trivial mitral valve regurgitation.  Tricuspid Valve: Tricsupid leaflet thickening is noted. The tricuspid valve is grossly normal. Tricuspid valve regurgitation is mild.  Aortic  Valve: The aortic valve is tricuspid. Aortic valve regurgitation is not visualized.  Pulmonic Valve: The pulmonic valve was normal in structure. Pulmonic valve regurgitation is not visualized.  Aorta: The aortic root and ascending aorta are structurally normal, with no evidence of dilitation.  Venous: The inferior vena cava is normal in size with greater than 50% respiratory variability, suggesting right atrial pressure of 3 mmHg.  IAS/Shunts: No atrial level shunt detected by color flow Doppler.   LEFT VENTRICLE PLAX 2D LVIDd:         4.70 cm         Diastology LVIDs:         2.90 cm         LV e' medial:    9.68 cm/s LV PW:         0.70 cm         LV E/e' medial:  9.9 LV IVS:        0.70 cm         LV e' lateral:   11.40 cm/s LVOT diam:     1.70 cm         LV E/e' lateral: 8.4 LV SV:         39 LV SV Index:   26              2D LVOT Area:     2.27 cm        Longitudinal Strain 2D Strain GLS  -25.1 % (A2C): 2D Strain GLS  -25.9 % (A3C): 2D Strain GLS  -25.5 % (A4C): 2D Strain GLS  -25.5 % Avg:  3D Volume EF LV 3D EF:    Left ventricul ar ejection fraction by 3D volume is 62 %.  3D Volume EF: 3D EF:        62 % LV EDV:       101 ml LV ESV:       38 ml LV SV:        63 ml  RIGHT VENTRICLE             IVC RV Basal diam:  4.10 cm     IVC diam: 1.90 cm RV S prime:     17.70 cm/s TAPSE (M-mode): 2.2 cm  LEFT ATRIUM             Index        RIGHT ATRIUM           Index LA diam:        3.50 cm 2.31  cm/m   RA Area:     10.70 cm LA Vol (A2C):   50.5 ml 33.27 ml/m  RA Volume:   23.80 ml  15.68 ml/m LA Vol (A4C):   26.1 ml 17.19 ml/m LA Biplane Vol: 37.7 ml 24.83 ml/m AORTIC VALVE             PULMONIC VALVE LVOT Vmax:   74.95 cm/s  PV Vmax:       0.94 m/s LVOT Vmean:  49.000 cm/s PV Peak grad:  3.5 mmHg LVOT VTI:    0.174 m  AORTA Ao Root diam: 3.10 cm Ao Asc diam:  2.90 cm  MITRAL VALVE               TRICUSPID VALVE MV Area (PHT): 4.60 cm    TR Peak  grad:   24.8 mmHg MV Decel Time: 165 msec    TR Vmax:        249.00 cm/s MV E velocity: 95.90 cm/s MV A velocity: 65.50 cm/s  SHUNTS MV E/A ratio:  1.46        Systemic VTI:  0.17 m Systemic Diam: 1.70 cm  Zoila Shutter MD Electronically signed by Zoila Shutter MD Signature Date/Time: 11/01/2021/11:13:50 AM    Final    MONITORS  CARDIAC EVENT MONITOR 07/08/2020            History of Present Illness:   Katrina Warren is a 62 y.o. female with the above problem list.  She presents today for cardiology follow up due to recent symptoms of chest pain. She was previously follwed by Dr. Tomie China, then transferred care to Dr. Katrinka Blazing. She was last seen by Dr. Katrinka Blazing in November 2023. Patient continues with Metoprolol Succinate 12.5 mg QD for tachycardia (had 30 sec-1 min episodes prior to starting beta blocker). She also previously reported chest pain and underwent a treadmill stress test in 2019 that was negative for stress arrhythmia or echocardiographic evidence of stress induced ischemia. Since her last office visit, patient reports that she has been doing well from cardiovascular health perspective. She gets regular physical activity and denies chest pain, palpitations, dyspnea. She does report 3 isolated episodes of atypical left lower chest discomfort while at rest. Symptoms were not provoked and lasted less than 1 minute. She did not feel like her heart was beating irregularly or rapidly. Otherwise she has been without shortness of breath, lower extremity edema, fatigue, palpitations, melena, hematuria, hemoptysis, diaphoresis, weakness, presyncope, syncope, orthopnea, and PND. Regarding diet, patient reports that she rarely eats red meat or fried foods. Occasionally will eat pizza or other less healthy foods on the weekends.      Past Medical History:  Diagnosis Date   Osteopenia    Current Medications: Current Meds  Medication Sig   Ascorbic Acid (VITAMIN C) 100 MG tablet Take 1,000  mg by mouth daily.    aspirin EC 81 MG tablet Take 81 mg by mouth daily. Swallow whole.   b complex vitamins tablet Take 1 tablet by mouth daily.   cholecalciferol (VITAMIN D) 1000 units tablet Take 2,000 Units by mouth daily.    Lysine 500 MG TABS Take 1 tablet by mouth daily at 6 (six) AM.   magnesium oxide (MAG-OX) 400 MG tablet Take 400 mg by mouth daily.   metoprolol succinate (TOPROL-XL) 25 MG 24 hr tablet Take 0.5 tablets (12.5 mg total) by mouth daily. Please keep scheduled appointment for future refills. Thank you.   OVER THE COUNTER MEDICATION Take  1 tablet by mouth daily. lysene 500mg    Rhubarb (ESTROVEN COMPLETE) 4 MG TABS Take 1 tablet by mouth daily at 6 (six) AM.    Allergies:   Patient has no known allergies.   Social History   Occupational History   Not on file  Tobacco Use   Smoking status: Former   Smokeless tobacco: Never  Substance and Sexual Activity   Alcohol use: Yes    Alcohol/week: 6.0 standard drinks of alcohol    Types: 6 Glasses of wine per week   Drug use: Never   Sexual activity: Not on file    Family Hx: The patient's family history is not on file. She was adopted.  Review of Systems  Constitutional: Negative for weight gain and weight loss.  Cardiovascular:  Negative for chest pain, dyspnea on exertion, irregular heartbeat, leg swelling, orthopnea, palpitations and syncope.  Respiratory:  Negative for cough, shortness of breath and snoring.   All other systems reviewed and are negative.    EKGs/Labs/Other Test Reviewed:    EKG:  EKG is ordered today.  The ekg ordered today demonstrates sinus rhythm with possible WPW pattern (previously seen). Stable tracing without ischemic changes.   Recent Labs: No results found for requested labs within last 365 days.   Recent Lipid Panel No results for input(s): "CHOL", "TRIG", "HDL", "VLDL", "LDLCALC", "LDLDIRECT" in the last 8760 hours.    Risk Assessment/Calculations/Metrics:               Physical Exam:    VS:  BP 124/80   Pulse 61   Ht 5' 0.5" (1.537 m)   Wt 123 lb (55.8 kg)   SpO2 98%   BMI 23.63 kg/m     Wt Readings from Last 3 Encounters:  05/31/23 123 lb (55.8 kg)  11/17/22 119 lb 9.6 oz (54.3 kg)  10/01/21 121 lb 3.2 oz (55 kg)    Constitutional:      Appearance: Healthy appearance. Not in distress.  Neck:     Vascular: No JVR. JVD normal.  Pulmonary:     Effort: Pulmonary effort is normal.     Breath sounds: Normal breath sounds. No wheezing. No rhonchi. No rales.  Chest:     Chest wall: Not tender to palpatation.  Cardiovascular:     PMI at left midclavicular line. Normal rate. Regular rhythm. Normal S1. Normal S2.      Murmurs: There is no murmur.     No gallop.  No click. No rub.  Pulses:    Intact distal pulses.  Edema:    Peripheral edema absent.  Abdominal:     General: Bowel sounds are normal.     Palpations: Abdomen is soft.     Tenderness: There is no abdominal tenderness.  Musculoskeletal: Normal range of motion.        General: No tenderness. Skin:    General: Skin is warm and dry.  Neurological:     General: No focal deficit present.     Mental Status: Alert and oriented to person, place and time.         ASSESSMENT & PLAN:   PSVT (paroxysmal supraventricular tachycardia) (HCC) Patient without recurrence of palpitations. Continue Metoprolol Succinate 12.5mg  QD.  Hyperlipidemia Last lipid panel in August of 2023 showed LDL 139, HDL 83. Although higher levels of HDL decrease risk of heart disease, would like to repeat lipid panel and consider initiation of statin if LDL remains elevated. Will also check lipoprotein A as patient's LDL  appears incongruent with overall healthy diet, frequent exercise, and healthy weight.   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 Today's ECG with similar morphology, slightly less convincing for WPW. Given that patient has tolerated a  beta blocker and in fact had reduction of tachycardia, will continue with Toprol XL 12.5mg . Theoretical risk of accessory pathway conduction with beta blockade and patient aware that she should let us know if she develops syncope or recurrent tachycardia.            Dispo:  Return in about 1 year (around 05/30/2024) for Routine follow up in 1 year with Dr. Anne Fu or Perlie Gold, PA-C.   Medication Adjustments/Labs and Tests Ordered: Current medicines are reviewed at length with the patient today.  Concerns regarding medicines are outlined above.  Tests Ordered: Orders Placed This Encounter  Procedures   Lipid Profile   Comp Met (CMET)   CBC   Lipoprotein A (LPA)   EKG 12-Lead   Medication Changes: No orders of the defined types were placed in this encounter.  Con Memos, PA-C  05/31/2023 11:08 AM    [facility] [address] [place], [place], [place]  [address] Phone: [phone]; Fax: [phone]

## 2023-05-31 ENCOUNTER — Encounter: Payer: Self-pay | Admitting: Cardiology

## 2023-05-31 ENCOUNTER — Ambulatory Visit: Payer: BC Managed Care – PPO | Attending: Cardiology | Admitting: Cardiology

## 2023-05-31 VITALS — BP 124/80 | HR 61 | Ht 60.5 in | Wt 123.0 lb

## 2023-05-31 DIAGNOSIS — I471 Supraventricular tachycardia, unspecified: Secondary | ICD-10-CM

## 2023-05-31 DIAGNOSIS — E782 Mixed hyperlipidemia: Secondary | ICD-10-CM | POA: Diagnosis not present

## 2023-05-31 DIAGNOSIS — Z1151 Encounter for screening for human papillomavirus (HPV): Secondary | ICD-10-CM | POA: Diagnosis not present

## 2023-05-31 DIAGNOSIS — I456 Pre-excitation syndrome: Secondary | ICD-10-CM | POA: Diagnosis not present

## 2023-05-31 DIAGNOSIS — Z124 Encounter for screening for malignant neoplasm of cervix: Secondary | ICD-10-CM | POA: Diagnosis not present

## 2023-05-31 DIAGNOSIS — E785 Hyperlipidemia, unspecified: Secondary | ICD-10-CM | POA: Insufficient documentation

## 2023-05-31 NOTE — Assessment & Plan Note (Signed)
Last lipid panel in August of 2023 showed LDL 139, HDL 83. Although higher levels of HDL decrease risk of heart disease, would like to repeat lipid panel and consider initiation of statin if LDL remains elevated. Will also check lipoprotein A as patient's LDL appears incongruent with overall healthy diet, frequent exercise, and healthy weight.

## 2023-05-31 NOTE — Assessment & Plan Note (Signed)
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 Today's ECG with similar morphology, slightly less convincing for WPW. Given that patient has tolerated a beta blocker and in fact had reduction of tachycardia, will continue with Toprol XL 12.5mg . Theoretical risk of accessory pathway conduction with beta blockade and patient aware that she should let us know if she develops syncope or recurrent tachycardia.

## 2023-05-31 NOTE — Assessment & Plan Note (Signed)
Patient without recurrence of palpitations. Continue Metoprolol Succinate 12.5mg  QD.

## 2023-05-31 NOTE — Patient Instructions (Signed)
Medication Instructions:   Your physician recommends that you continue on your current medications as directed. Please refer to the Current Medication list given to you today.   *If you need a refill on your cardiac medications before your next appointment, please call your pharmacy*   Lab Work:  Your physician recommends that you return for a FASTING lipid profile/lipoprotein a/cbc/cmet on Monday June 17. You can come in on the day of your appointment anytime between 7:30-4:30 fasting from midnight the night before.     If you have labs (blood work) drawn today and your tests are completely normal, you will receive your results only by: MyChart Message (if you have MyChart) OR A paper copy in the mail If you have any lab test that is abnormal or we need to change your treatment, we will call you to review the results.   Testing/Procedures:  None ordered.   Follow-Up: At [facility], you and your health needs are our priority.  As part of our continuing mission to provide you with exceptional heart care, we have created designated Provider Care Teams.  These Care Teams include your primary Cardiologist (physician) and Advanced Practice Providers (APPs -  Physician Assistants and Nurse Practitioners) who all work together to provide you with the care you need, when you need it.  We recommend signing up for the patient portal called "MyChart".  Sign up information is provided on this After Visit Summary.  MyChart is used to connect with patients for Virtual Visits (Telemedicine).  Patients are able to view lab/test results, encounter notes, upcoming appointments, etc.  Non-urgent messages can be sent to your provider as well.   To learn more about what you can do with MyChart, go to [email or web address].    Your next appointment:   1 year(s)  Louis Matte or Perlie Gold, PA-C.     Other Instructions  Your physician wants you to follow-up in: 1 year.  You will  receive a reminder letter in the mail two months in advance. If you don't receive a letter, please call our office to schedule the follow-up appointment.

## 2023-06-05 ENCOUNTER — Ambulatory Visit: Payer: BC Managed Care – PPO | Attending: Cardiology

## 2023-06-05 DIAGNOSIS — I471 Supraventricular tachycardia, unspecified: Secondary | ICD-10-CM | POA: Diagnosis not present

## 2023-06-05 DIAGNOSIS — I456 Pre-excitation syndrome: Secondary | ICD-10-CM | POA: Diagnosis not present

## 2023-06-06 LAB — COMPREHENSIVE METABOLIC PANEL
ALT: 21 IU/L (ref 0–32)
AST: 32 IU/L (ref 0–40)
Albumin: 4.3 g/dL (ref 3.9–4.9)
Alkaline Phosphatase: 67 IU/L (ref 44–121)
BUN/Creatinine Ratio: 21 (ref 12–28)
BUN: 14 mg/dL (ref 8–27)
Bilirubin Total: 0.4 mg/dL (ref 0.0–1.2)
CO2: 26 mmol/L (ref 20–29)
Calcium: 8.4 mg/dL — ABNORMAL LOW (ref 8.7–10.3)
Chloride: 102 mmol/L (ref 96–106)
Creatinine, Ser: 0.68 mg/dL (ref 0.57–1.00)
Globulin, Total: 1.6 g/dL (ref 1.5–4.5)
Glucose: 104 mg/dL — ABNORMAL HIGH (ref 70–99)
Potassium: 4.5 mmol/L (ref 3.5–5.2)
Sodium: 140 mmol/L (ref 134–144)
Total Protein: 5.9 g/dL — ABNORMAL LOW (ref 6.0–8.5)
eGFR: 97 mL/min/{1.73_m2} (ref >59)

## 2023-06-06 LAB — CBC
Hematocrit: 37.8 % (ref 34.0–46.6)
Hemoglobin: 12.5 g/dL (ref 11.1–15.9)
MCH: 31.4 pg (ref 26.6–33.0)
MCHC: 33.1 g/dL (ref 31.5–35.7)
MCV: 95 fL (ref 79–97)
Platelets: 199 10*3/uL (ref 150–450)
RBC: 3.98 x10E6/uL (ref 3.77–5.28)
RDW: 11.5 % — ABNORMAL LOW (ref 11.7–15.4)
WBC: 4.2 10*3/uL (ref 3.4–10.8)

## 2023-06-06 LAB — LIPID PANEL
Chol/HDL Ratio: 2.5 ratio (ref 0.0–4.4)
Cholesterol, Total: 209 mg/dL — ABNORMAL HIGH (ref 100–199)
HDL: 82 mg/dL (ref >39)
LDL Chol Calc (NIH): 116 mg/dL — ABNORMAL HIGH (ref 0–99)
Triglycerides: 64 mg/dL (ref 0–149)
VLDL Cholesterol Cal: 11 mg/dL (ref 5–40)

## 2023-06-06 LAB — LIPOPROTEIN A (LPA): Lipoprotein (a): 196.6 nmol/L — ABNORMAL HIGH (ref <75.0)

## 2023-06-07 ENCOUNTER — Other Ambulatory Visit: Payer: Self-pay | Admitting: *Deleted

## 2023-06-07 DIAGNOSIS — E782 Mixed hyperlipidemia: Secondary | ICD-10-CM

## 2023-06-20 ENCOUNTER — Ambulatory Visit: Payer: BC Managed Care – PPO | Admitting: Nurse Practitioner

## 2023-07-03 ENCOUNTER — Ambulatory Visit: Payer: BC Managed Care – PPO

## 2023-08-17 DIAGNOSIS — E559 Vitamin D deficiency, unspecified: Secondary | ICD-10-CM | POA: Diagnosis not present

## 2023-08-17 DIAGNOSIS — Z0000 Encounter for general adult medical examination without abnormal findings: Secondary | ICD-10-CM | POA: Diagnosis not present

## 2023-08-17 DIAGNOSIS — E7800 Pure hypercholesterolemia, unspecified: Secondary | ICD-10-CM | POA: Diagnosis not present

## 2023-08-17 DIAGNOSIS — Z79899 Other long term (current) drug therapy: Secondary | ICD-10-CM | POA: Diagnosis not present

## 2023-08-17 DIAGNOSIS — Z23 Encounter for immunization: Secondary | ICD-10-CM | POA: Diagnosis not present

## 2023-08-17 DIAGNOSIS — R7309 Other abnormal glucose: Secondary | ICD-10-CM | POA: Diagnosis not present

## 2023-08-22 ENCOUNTER — Ambulatory Visit: Payer: BC Managed Care – PPO | Attending: Internal Medicine | Admitting: Pharmacist

## 2023-08-22 DIAGNOSIS — E782 Mixed hyperlipidemia: Secondary | ICD-10-CM | POA: Diagnosis not present

## 2023-08-22 DIAGNOSIS — R079 Chest pain, unspecified: Secondary | ICD-10-CM | POA: Diagnosis not present

## 2023-08-22 NOTE — Assessment & Plan Note (Addendum)
Assessment: Overall patient does not have very many risk factors Her LDL cholesterol is above goal of less than 100 or less than 70 Her non-HDL cholesterol is at goal of less than 130 (less aggressive goal) and is above goal of less than 100 (more aggressive goal) She is very physically active and does a lot of cardio No strength training Does not smoke could decrease alcohol intake some and we discussed this Blood pressure well-controlled We reviewed statin treatment Reviewed PCSK9 however with insurance will need statin before this is approved Discussed CAC scoring  Would recommend starting rosuvastatin 5 mg 3 times a week if patient willing  Plan: Patient will be scheduled for a coronary calcium score self-pay Once CAC score results we will discuss treatment further with patient Add in resistance training Cut back on alcohol -limit to 1 drink per occasion

## 2023-08-22 NOTE — Progress Notes (Signed)
Patient ID: Katrina Warren                 DOB: [birth year]                    MRN: [id number]      HPI: Katrina Warren is a 62 y.o. female patient referred to lipid clinic by Perlie Gold, PA. PMH is significant for PSVT and HLD. 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 Stress echo in 2019 negative for ischemia.   Was recommended that patient start statin, however patient declined. She was agreeable to see lipid clinic.  Patient presents today to lipid clinic.  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  Discussed that PCSK9i do lower LP(a) about 20% however we do not know if this is clinically meaningful.  We also discussed how insurance will not pay for PCSK9 unless they have tried statins. Reviewed statin treatment, side effects and common misinformation. Her 30-year prevent score is 2.6% Could consider an LDL goal less than 100 (less aggressive) versus goal of less than 70 (more aggressive) due to elevated LP(a).  Coronary calcium score would help give [place] more information to determine best goal. We do not know family history because she is adopted. We did discuss coronary calcium scoring to give [place] a better idea of her risk.   Current Medications: None Intolerances: None Risk Factors: Elevated LP(a) LDL-C goal: <100, could consider <70 due to elevated LP(a)  Diet: low fat, high fiber Down fall is salty things (chips) salad  Exercise: walks 2.5 miles (40 min) x 2 per day, exercise bike 35 min  Family History: was adopted  Social History: quit smoking many years ago, + ETOH, Wine 2 glasses 5 times a week  Labs: Lipid Panel     Component Value Date/Time   CHOL 209 (H) 06/05/2023 0737   TRIG 64 06/05/2023 0737   HDL 82 06/05/2023 0737   CHOLHDL 2.5 06/05/2023 0737   LDLCALC 116 (H) 06/05/2023 0737   LABVLDL 11 06/05/2023 0737    Past Medical History:  Diagnosis Date   Osteopenia     Current Outpatient Medications on File Prior to Visit  Medication  Sig Dispense Refill   Ascorbic Acid (VITAMIN C) 100 MG tablet Take 1,000 mg by mouth daily.      aspirin EC 81 MG tablet Take 81 mg by mouth daily. Swallow whole.     b complex vitamins tablet Take 1 tablet by mouth daily.     cholecalciferol (VITAMIN D) 1000 units tablet Take 2,000 Units by mouth daily.      clonazePAM (KLONOPIN) 0.5 MG tablet Take 0.5 mg by mouth as needed for anxiety.     Lysine 500 MG TABS Take 1 tablet by mouth daily at 6 (six) AM.     magnesium oxide (MAG-OX) 400 MG tablet Take 400 mg by mouth daily.     metoprolol succinate (TOPROL-XL) 25 MG 24 hr tablet Take 0.5 tablets (12.5 mg total) by mouth daily. Please keep scheduled appointment for future refills. Thank you. 45 tablet 3   OVER THE COUNTER MEDICATION Take 1 tablet by mouth daily. lysene 500mg      Rhubarb (ESTROVEN COMPLETE) 4 MG TABS Take 1 tablet by mouth daily at 6 (six) AM.     No current facility-administered medications on file prior to visit.    No Known Allergies  Assessment/Plan:  1. Hyperlipidemia -  Hyperlipidemia Assessment: Overall patient does not have very  many risk factors Her LDL cholesterol is above goal of less than 100 or less than 70 Her non-HDL cholesterol is at goal of less than 130 (less aggressive goal) and is above goal of less than 100 (more aggressive goal) She is very physically active and does a lot of cardio No strength training Does not smoke could decrease alcohol intake some and we discussed this Blood pressure well-controlled We reviewed statin treatment Reviewed PCSK9 however with insurance will need statin before this is approved Discussed CAC scoring  Would recommend starting rosuvastatin 5 mg 3 times a week if patient willing  Plan: Patient will be scheduled for a coronary calcium score self-pay Once CAC score results we will discuss treatment further with patient Add in resistance training Cut back on alcohol -limit to 1 drink per occasion   Thank  you,  Katrina Warren, Pharm.D, BCACP, BCPS, CPP [facility] [address] [place], [place] [address]  Phone: [phone]; Fax: [phone]

## 2023-08-29 ENCOUNTER — Ambulatory Visit: Payer: BC Managed Care – PPO

## 2023-09-11 ENCOUNTER — Ambulatory Visit (HOSPITAL_BASED_OUTPATIENT_CLINIC_OR_DEPARTMENT_OTHER)
Admission: RE | Admit: 2023-09-11 | Discharge: 2023-09-11 | Disposition: A | Discharge status: Home / Self Care | Payer: BC Managed Care – PPO | Source: Ambulatory Visit | Attending: Cardiology | Admitting: Cardiology

## 2023-09-11 DIAGNOSIS — E782 Mixed hyperlipidemia: Secondary | ICD-10-CM | POA: Insufficient documentation

## 2023-09-11 DIAGNOSIS — R079 Chest pain, unspecified: Secondary | ICD-10-CM | POA: Insufficient documentation

## 2023-09-12 ENCOUNTER — Telehealth: Payer: Self-pay | Admitting: Pharmacist

## 2023-09-12 NOTE — Telephone Encounter (Addendum)
Reviewed coronary score with patient.  CAC of 60 which put her in the 78th to the 83rd percentile.  Reviewed what this meant and recommendations for treatment.  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  MESA 10 year score 3.3 with CAC, 1.9% without. Recommended trying a statin.  Patient previously seen in clinic by me with hesitancy to start statins or cholesterol medication.  Had been referred for PCSK9 however without statin therapy insurance will not pay for PCSK9.  I recommended we start with rosuvastatin 5 mg 3 times a week to see how patient tolerated.  At that point if LDL-C was above goal we could consider increasing to more frequently if patient was tolerating well.  Patient would like to think about this and will call me back.

## 2023-11-06 DIAGNOSIS — L821 Other seborrheic keratosis: Secondary | ICD-10-CM | POA: Diagnosis not present

## 2023-11-06 DIAGNOSIS — L578 Other skin changes due to chronic exposure to nonionizing radiation: Secondary | ICD-10-CM | POA: Diagnosis not present

## 2023-11-06 DIAGNOSIS — L814 Other melanin hyperpigmentation: Secondary | ICD-10-CM | POA: Diagnosis not present

## 2023-11-06 DIAGNOSIS — D229 Melanocytic nevi, unspecified: Secondary | ICD-10-CM | POA: Diagnosis not present

## 2023-11-06 DIAGNOSIS — L570 Actinic keratosis: Secondary | ICD-10-CM | POA: Diagnosis not present

## 2023-11-06 DIAGNOSIS — L820 Inflamed seborrheic keratosis: Secondary | ICD-10-CM | POA: Diagnosis not present

## 2023-11-20 ENCOUNTER — Encounter: Payer: Self-pay | Admitting: Pharmacist

## 2023-11-20 DIAGNOSIS — Z1231 Encounter for screening mammogram for malignant neoplasm of breast: Secondary | ICD-10-CM | POA: Diagnosis not present

## 2023-11-20 DIAGNOSIS — Z01419 Encounter for gynecological examination (general) (routine) without abnormal findings: Secondary | ICD-10-CM | POA: Diagnosis not present

## 2023-11-20 DIAGNOSIS — Z6824 Body mass index (BMI) 24.0-24.9, adult: Secondary | ICD-10-CM | POA: Diagnosis not present

## 2023-11-20 DIAGNOSIS — Z124 Encounter for screening for malignant neoplasm of cervix: Secondary | ICD-10-CM | POA: Diagnosis not present

## 2023-11-20 DIAGNOSIS — Z1151 Encounter for screening for human papillomavirus (HPV): Secondary | ICD-10-CM | POA: Diagnosis not present

## 2023-11-20 DIAGNOSIS — E782 Mixed hyperlipidemia: Secondary | ICD-10-CM

## 2023-11-22 MED ORDER — ROSUVASTATIN CALCIUM 5 MG PO TABS: 5.0000 mg | ORAL_TABLET | Freq: Every day | ORAL | 3 refills | Status: DC

## 2023-12-04 DIAGNOSIS — M9901 Segmental and somatic dysfunction of cervical region: Secondary | ICD-10-CM | POA: Diagnosis not present

## 2023-12-04 DIAGNOSIS — M5412 Radiculopathy, cervical region: Secondary | ICD-10-CM | POA: Diagnosis not present

## 2023-12-04 DIAGNOSIS — M9903 Segmental and somatic dysfunction of lumbar region: Secondary | ICD-10-CM | POA: Diagnosis not present

## 2023-12-04 DIAGNOSIS — M47816 Spondylosis without myelopathy or radiculopathy, lumbar region: Secondary | ICD-10-CM | POA: Diagnosis not present

## 2023-12-05 DIAGNOSIS — M9903 Segmental and somatic dysfunction of lumbar region: Secondary | ICD-10-CM | POA: Diagnosis not present

## 2023-12-05 DIAGNOSIS — M47816 Spondylosis without myelopathy or radiculopathy, lumbar region: Secondary | ICD-10-CM | POA: Diagnosis not present

## 2023-12-05 DIAGNOSIS — M5412 Radiculopathy, cervical region: Secondary | ICD-10-CM | POA: Diagnosis not present

## 2023-12-05 DIAGNOSIS — M9901 Segmental and somatic dysfunction of cervical region: Secondary | ICD-10-CM | POA: Diagnosis not present

## 2023-12-07 DIAGNOSIS — M9903 Segmental and somatic dysfunction of lumbar region: Secondary | ICD-10-CM | POA: Diagnosis not present

## 2023-12-07 DIAGNOSIS — M9901 Segmental and somatic dysfunction of cervical region: Secondary | ICD-10-CM | POA: Diagnosis not present

## 2023-12-07 DIAGNOSIS — M47816 Spondylosis without myelopathy or radiculopathy, lumbar region: Secondary | ICD-10-CM | POA: Diagnosis not present

## 2023-12-07 DIAGNOSIS — M5412 Radiculopathy, cervical region: Secondary | ICD-10-CM | POA: Diagnosis not present

## 2023-12-08 DIAGNOSIS — M5412 Radiculopathy, cervical region: Secondary | ICD-10-CM | POA: Diagnosis not present

## 2023-12-08 DIAGNOSIS — M47816 Spondylosis without myelopathy or radiculopathy, lumbar region: Secondary | ICD-10-CM | POA: Diagnosis not present

## 2023-12-08 DIAGNOSIS — M9901 Segmental and somatic dysfunction of cervical region: Secondary | ICD-10-CM | POA: Diagnosis not present

## 2023-12-08 DIAGNOSIS — M9903 Segmental and somatic dysfunction of lumbar region: Secondary | ICD-10-CM | POA: Diagnosis not present

## 2023-12-11 DIAGNOSIS — M9901 Segmental and somatic dysfunction of cervical region: Secondary | ICD-10-CM | POA: Diagnosis not present

## 2023-12-11 DIAGNOSIS — M5412 Radiculopathy, cervical region: Secondary | ICD-10-CM | POA: Diagnosis not present

## 2023-12-11 DIAGNOSIS — M47816 Spondylosis without myelopathy or radiculopathy, lumbar region: Secondary | ICD-10-CM | POA: Diagnosis not present

## 2023-12-11 DIAGNOSIS — M9903 Segmental and somatic dysfunction of lumbar region: Secondary | ICD-10-CM | POA: Diagnosis not present

## 2023-12-18 DIAGNOSIS — H40013 Open angle with borderline findings, low risk, bilateral: Secondary | ICD-10-CM | POA: Diagnosis not present

## 2023-12-21 DIAGNOSIS — M5412 Radiculopathy, cervical region: Secondary | ICD-10-CM | POA: Diagnosis not present

## 2023-12-21 DIAGNOSIS — M9901 Segmental and somatic dysfunction of cervical region: Secondary | ICD-10-CM | POA: Diagnosis not present

## 2023-12-21 DIAGNOSIS — M47816 Spondylosis without myelopathy or radiculopathy, lumbar region: Secondary | ICD-10-CM | POA: Diagnosis not present

## 2023-12-21 DIAGNOSIS — M9903 Segmental and somatic dysfunction of lumbar region: Secondary | ICD-10-CM | POA: Diagnosis not present

## 2023-12-22 DIAGNOSIS — M9903 Segmental and somatic dysfunction of lumbar region: Secondary | ICD-10-CM | POA: Diagnosis not present

## 2023-12-22 DIAGNOSIS — M5412 Radiculopathy, cervical region: Secondary | ICD-10-CM | POA: Diagnosis not present

## 2023-12-22 DIAGNOSIS — M9901 Segmental and somatic dysfunction of cervical region: Secondary | ICD-10-CM | POA: Diagnosis not present

## 2023-12-22 DIAGNOSIS — M47816 Spondylosis without myelopathy or radiculopathy, lumbar region: Secondary | ICD-10-CM | POA: Diagnosis not present

## 2023-12-25 DIAGNOSIS — M5412 Radiculopathy, cervical region: Secondary | ICD-10-CM | POA: Diagnosis not present

## 2023-12-25 DIAGNOSIS — M47816 Spondylosis without myelopathy or radiculopathy, lumbar region: Secondary | ICD-10-CM | POA: Diagnosis not present

## 2023-12-25 DIAGNOSIS — M9903 Segmental and somatic dysfunction of lumbar region: Secondary | ICD-10-CM | POA: Diagnosis not present

## 2023-12-25 DIAGNOSIS — M9901 Segmental and somatic dysfunction of cervical region: Secondary | ICD-10-CM | POA: Diagnosis not present

## 2023-12-26 DIAGNOSIS — M47816 Spondylosis without myelopathy or radiculopathy, lumbar region: Secondary | ICD-10-CM | POA: Diagnosis not present

## 2023-12-26 DIAGNOSIS — M9901 Segmental and somatic dysfunction of cervical region: Secondary | ICD-10-CM | POA: Diagnosis not present

## 2023-12-26 DIAGNOSIS — M5412 Radiculopathy, cervical region: Secondary | ICD-10-CM | POA: Diagnosis not present

## 2023-12-26 DIAGNOSIS — M9903 Segmental and somatic dysfunction of lumbar region: Secondary | ICD-10-CM | POA: Diagnosis not present

## 2023-12-28 DIAGNOSIS — M9901 Segmental and somatic dysfunction of cervical region: Secondary | ICD-10-CM | POA: Diagnosis not present

## 2023-12-28 DIAGNOSIS — M47816 Spondylosis without myelopathy or radiculopathy, lumbar region: Secondary | ICD-10-CM | POA: Diagnosis not present

## 2023-12-28 DIAGNOSIS — M5412 Radiculopathy, cervical region: Secondary | ICD-10-CM | POA: Diagnosis not present

## 2023-12-28 DIAGNOSIS — M9903 Segmental and somatic dysfunction of lumbar region: Secondary | ICD-10-CM | POA: Diagnosis not present

## 2024-01-01 DIAGNOSIS — M9903 Segmental and somatic dysfunction of lumbar region: Secondary | ICD-10-CM | POA: Diagnosis not present

## 2024-01-01 DIAGNOSIS — M5412 Radiculopathy, cervical region: Secondary | ICD-10-CM | POA: Diagnosis not present

## 2024-01-01 DIAGNOSIS — M9901 Segmental and somatic dysfunction of cervical region: Secondary | ICD-10-CM | POA: Diagnosis not present

## 2024-01-01 DIAGNOSIS — M47816 Spondylosis without myelopathy or radiculopathy, lumbar region: Secondary | ICD-10-CM | POA: Diagnosis not present

## 2024-01-03 DIAGNOSIS — M9903 Segmental and somatic dysfunction of lumbar region: Secondary | ICD-10-CM | POA: Diagnosis not present

## 2024-01-03 DIAGNOSIS — M5412 Radiculopathy, cervical region: Secondary | ICD-10-CM | POA: Diagnosis not present

## 2024-01-03 DIAGNOSIS — M9901 Segmental and somatic dysfunction of cervical region: Secondary | ICD-10-CM | POA: Diagnosis not present

## 2024-01-03 DIAGNOSIS — M47816 Spondylosis without myelopathy or radiculopathy, lumbar region: Secondary | ICD-10-CM | POA: Diagnosis not present

## 2024-01-24 ENCOUNTER — Other Ambulatory Visit: Payer: Self-pay

## 2024-01-24 MED ORDER — METOPROLOL SUCCINATE ER 25 MG PO TB24: 12.5000 mg | ORAL_TABLET | Freq: Every day | ORAL | 1 refills | Status: DC

## 2024-02-16 DIAGNOSIS — E782 Mixed hyperlipidemia: Secondary | ICD-10-CM | POA: Diagnosis not present

## 2024-02-17 LAB — LIPID PANEL
Chol/HDL Ratio: 2.1 ratio (ref 0.0–4.4)
Cholesterol, Total: 193 mg/dL (ref 100–199)
HDL: 90 mg/dL (ref >39)
LDL Chol Calc (NIH): 94 mg/dL (ref 0–99)
Triglycerides: 46 mg/dL (ref 0–149)
VLDL Cholesterol Cal: 9 mg/dL (ref 5–40)

## 2024-02-17 LAB — HEPATIC FUNCTION PANEL
ALT: 24 IU/L (ref 0–32)
AST: 34 IU/L (ref 0–40)
Albumin: 4.5 g/dL (ref 3.9–4.9)
Alkaline Phosphatase: 65 IU/L (ref 44–121)
Bilirubin Total: 0.6 mg/dL (ref 0.0–1.2)
Bilirubin, Direct: 0.19 mg/dL (ref 0.00–0.40)
Total Protein: 6.4 g/dL (ref 6.0–8.5)

## 2024-02-20 MED ORDER — ROSUVASTATIN CALCIUM 10 MG PO TABS: 10.0000 mg | ORAL_TABLET | Freq: Every day | ORAL | 3 refills | Status: DC

## 2024-02-20 NOTE — Addendum Note (Signed)
 Addended by: Malena Peer D on: 02/20/2024 11:17 AM   Modules accepted: Orders

## 2024-04-19 DIAGNOSIS — M542 Cervicalgia: Secondary | ICD-10-CM | POA: Diagnosis not present

## 2024-05-06 DIAGNOSIS — M436 Torticollis: Secondary | ICD-10-CM | POA: Diagnosis not present

## 2024-05-06 DIAGNOSIS — M542 Cervicalgia: Secondary | ICD-10-CM | POA: Diagnosis not present

## 2024-05-14 DIAGNOSIS — M436 Torticollis: Secondary | ICD-10-CM | POA: Diagnosis not present

## 2024-05-14 DIAGNOSIS — M542 Cervicalgia: Secondary | ICD-10-CM | POA: Diagnosis not present

## 2024-05-21 DIAGNOSIS — M542 Cervicalgia: Secondary | ICD-10-CM | POA: Diagnosis not present

## 2024-05-21 DIAGNOSIS — M436 Torticollis: Secondary | ICD-10-CM | POA: Diagnosis not present

## 2024-05-23 DIAGNOSIS — Z8742 Personal history of other diseases of the female genital tract: Secondary | ICD-10-CM | POA: Diagnosis not present

## 2024-05-27 DIAGNOSIS — M436 Torticollis: Secondary | ICD-10-CM | POA: Diagnosis not present

## 2024-05-27 DIAGNOSIS — M542 Cervicalgia: Secondary | ICD-10-CM | POA: Diagnosis not present

## 2024-06-03 ENCOUNTER — Encounter: Payer: Self-pay | Admitting: Cardiology

## 2024-06-06 DIAGNOSIS — L820 Inflamed seborrheic keratosis: Secondary | ICD-10-CM | POA: Diagnosis not present

## 2024-06-06 DIAGNOSIS — L570 Actinic keratosis: Secondary | ICD-10-CM | POA: Diagnosis not present

## 2024-06-06 DIAGNOSIS — L814 Other melanin hyperpigmentation: Secondary | ICD-10-CM | POA: Diagnosis not present

## 2024-06-06 DIAGNOSIS — R87615 Unsatisfactory cytologic smear of cervix: Secondary | ICD-10-CM | POA: Diagnosis not present

## 2024-06-06 DIAGNOSIS — L821 Other seborrheic keratosis: Secondary | ICD-10-CM | POA: Diagnosis not present

## 2024-06-25 ENCOUNTER — Encounter: Payer: Self-pay | Admitting: Pharmacist

## 2024-06-25 DIAGNOSIS — E782 Mixed hyperlipidemia: Secondary | ICD-10-CM

## 2024-06-27 DIAGNOSIS — E782 Mixed hyperlipidemia: Secondary | ICD-10-CM | POA: Diagnosis not present

## 2024-06-28 ENCOUNTER — Ambulatory Visit: Payer: Self-pay | Admitting: Pharmacist

## 2024-06-28 LAB — LIPID PANEL
Chol/HDL Ratio: 1.9 ratio (ref 0.0–4.4)
Cholesterol, Total: 178 mg/dL (ref 100–199)
HDL: 95 mg/dL (ref >39)
LDL Chol Calc (NIH): 72 mg/dL (ref 0–99)
Triglycerides: 58 mg/dL (ref 0–149)
VLDL Cholesterol Cal: 11 mg/dL (ref 5–40)

## 2024-06-28 LAB — HEPATIC FUNCTION PANEL
ALT: 20 IU/L (ref 0–32)
AST: 26 IU/L (ref 0–40)
Albumin: 4.4 g/dL (ref 3.9–4.9)
Alkaline Phosphatase: 58 IU/L (ref 44–121)
Bilirubin Total: 0.8 mg/dL (ref 0.0–1.2)
Bilirubin, Direct: 0.27 mg/dL (ref 0.00–0.40)
Total Protein: 6 g/dL (ref 6.0–8.5)

## 2024-07-27 ENCOUNTER — Other Ambulatory Visit: Payer: Self-pay | Admitting: Cardiology

## 2024-07-30 ENCOUNTER — Encounter: Payer: Self-pay | Admitting: Pharmacist

## 2024-07-30 ENCOUNTER — Encounter: Payer: Self-pay | Admitting: Internal Medicine

## 2024-07-30 DIAGNOSIS — R002 Palpitations: Secondary | ICD-10-CM

## 2024-07-30 DIAGNOSIS — I471 Supraventricular tachycardia, unspecified: Secondary | ICD-10-CM

## 2024-07-31 ENCOUNTER — Ambulatory Visit: Payer: Self-pay | Attending: Cardiology

## 2024-07-31 DIAGNOSIS — R002 Palpitations: Secondary | ICD-10-CM

## 2024-07-31 DIAGNOSIS — I471 Supraventricular tachycardia, unspecified: Secondary | ICD-10-CM

## 2024-07-31 MED ORDER — METOPROLOL SUCCINATE ER 25 MG PO TB24: 25.0000 mg | ORAL_TABLET | Freq: Every day | ORAL | 3 refills | Status: DC

## 2024-07-31 NOTE — Progress Notes (Unsigned)
 Enrolled patient for a 14 day Zio XT monitor to be mailed to patients home  Chandrasekhar to read

## 2024-07-31 NOTE — Telephone Encounter (Signed)
 Called pt in regards to elevated HR.  Reports yesterday evening took a walk returned home around 5:15 pm and high HR started around 6:16 lasted until around 10 PM.  Highest HR noted was 182.  Pt has a hx of PSVT and [employer] takes metoprolol  succinate 12.5 mg daily at bedtime as ordered.   Pt reports HR 124 now while out shopping.  Reports felt a little dizzy last night.  Denies SOB, no increased caffeine intake and no recent illness.    Pt does not check BP reports normal HR is 60-80.  Advised will send to Provider to advise.

## 2024-07-31 NOTE — Addendum Note (Signed)
 Addended by: RANDY HAMP SAILOR on: 07/31/2024 02:53 PM   Modules accepted: Orders

## 2024-07-31 NOTE — Telephone Encounter (Signed)
 Called pt advised of Providers response: Please have patient increase her Toprol  XL to 25mg  daily. I would also like her to wear a 14 day Zio. Please arrange APP follow up in the next month. If significant symptoms such as severe dizziness/feeling like passing out, would advise ED visit.   Artist Pouch, PA-C    Pt expresses understanding. All questions answered. Script sent to pharmacy of pt choice.

## 2024-08-20 DIAGNOSIS — E7800 Pure hypercholesterolemia, unspecified: Secondary | ICD-10-CM | POA: Diagnosis not present

## 2024-08-20 DIAGNOSIS — E559 Vitamin D deficiency, unspecified: Secondary | ICD-10-CM | POA: Diagnosis not present

## 2024-08-20 DIAGNOSIS — R7309 Other abnormal glucose: Secondary | ICD-10-CM | POA: Diagnosis not present

## 2024-08-20 DIAGNOSIS — Z79899 Other long term (current) drug therapy: Secondary | ICD-10-CM | POA: Diagnosis not present

## 2024-08-22 DIAGNOSIS — Z0000 Encounter for general adult medical examination without abnormal findings: Secondary | ICD-10-CM | POA: Diagnosis not present

## 2024-08-22 DIAGNOSIS — E7800 Pure hypercholesterolemia, unspecified: Secondary | ICD-10-CM | POA: Diagnosis not present

## 2024-08-22 DIAGNOSIS — Z79899 Other long term (current) drug therapy: Secondary | ICD-10-CM | POA: Diagnosis not present

## 2024-08-22 DIAGNOSIS — E2839 Other primary ovarian failure: Secondary | ICD-10-CM | POA: Diagnosis not present

## 2024-08-22 DIAGNOSIS — Z23 Encounter for immunization: Secondary | ICD-10-CM | POA: Diagnosis not present

## 2024-08-22 DIAGNOSIS — R7309 Other abnormal glucose: Secondary | ICD-10-CM | POA: Diagnosis not present

## 2024-08-22 DIAGNOSIS — M542 Cervicalgia: Secondary | ICD-10-CM | POA: Diagnosis not present

## 2024-08-22 DIAGNOSIS — E559 Vitamin D deficiency, unspecified: Secondary | ICD-10-CM | POA: Diagnosis not present

## 2024-08-29 DIAGNOSIS — R002 Palpitations: Secondary | ICD-10-CM | POA: Diagnosis not present

## 2024-08-29 DIAGNOSIS — I471 Supraventricular tachycardia, unspecified: Secondary | ICD-10-CM | POA: Diagnosis not present

## 2024-09-01 ENCOUNTER — Ambulatory Visit: Payer: Self-pay | Admitting: Cardiology

## 2024-09-01 DIAGNOSIS — I456 Pre-excitation syndrome: Secondary | ICD-10-CM

## 2024-09-01 DIAGNOSIS — R002 Palpitations: Secondary | ICD-10-CM

## 2024-09-01 DIAGNOSIS — I471 Supraventricular tachycardia, unspecified: Secondary | ICD-10-CM

## 2024-09-10 ENCOUNTER — Encounter: Payer: Self-pay | Admitting: Cardiology

## 2024-09-10 ENCOUNTER — Ambulatory Visit: Attending: Cardiology | Admitting: Cardiology

## 2024-09-10 VITALS — BP 120/80 | HR 60 | Ht 60.0 in | Wt 121.2 lb

## 2024-09-10 DIAGNOSIS — I471 Supraventricular tachycardia, unspecified: Secondary | ICD-10-CM | POA: Diagnosis not present

## 2024-09-10 DIAGNOSIS — R002 Palpitations: Secondary | ICD-10-CM | POA: Diagnosis not present

## 2024-09-10 DIAGNOSIS — I456 Pre-excitation syndrome: Secondary | ICD-10-CM

## 2024-09-10 NOTE — Patient Instructions (Signed)
 Medication Instructions:  Your physician recommends that you continue on your current medications as directed. Please refer to the Current Medication list given to you today.  *If you need a refill on your cardiac medications before your next appointment, please call your pharmacy*  Testing/Procedures: Ablation Your physician has recommended that you have an ablation. Catheter ablation is a medical procedure used to treat some cardiac arrhythmias (irregular heartbeats). During catheter ablation, a long, thin, flexible tube is put into a blood vessel in your groin (upper thigh), or neck. This tube is called an ablation catheter. It is then guided to your heart through the blood vessel. Radio frequency waves destroy small areas of heart tissue where abnormal heartbeats may cause an arrhythmia to start.   You are scheduled for EP Study/SVT Ablation on Monday, December 8 with Dr. Sidra Kitty.Please arrive at the Main Entrance A at [facility]: [address] [place], [place] [address] at 10:30 AM   What To Expect:  Labs: you will need to have lab work drawn within 30 days of your procedure. Please go to any [facility] location to have these drawn - no appointment is needed. You will receive procedure instructions either through MyChart or in the mail 4-6 week prior to your procedure.  After your procedure we recommend no driving for 4 days, no lifting over 5 lbs for 7 days, and no work or strenuous activity for 7 days.  Please contact our office at [phone] if you have any questions.    Follow-Up: We will contact you to schedule your post-procedure appointments.

## 2024-09-10 NOTE — Progress Notes (Signed)
 Electrophysiology Office Note:   Date:  09/11/2024  ID:  Katrina Warren, DOB 12-15-1961, MRN [id number]  Primary Cardiologist: Victory LELON Claudene DOUGLAS, MD (Inactive) Electrophysiologist: Fonda Kitty, MD      History of Present Illness:   Katrina Warren is a 63 y.o. female with h/o palpitations and SVT who is being seen today for EP evaluation.  Discussed the use of AI scribe software for clinical note transcription with the patient, who gave verbal consent to proceed.  History of Present Illness Katrina Warren is a 63 year old female with episodes of supraventricular tachycardia who presents with fast heartbeats. She was referred by Artist Pouch, PA for evaluation of her heart rhythm issues.  She experiences episodes of fast heartbeats approximately three to four times a month, which have become more noticeable with age. The most severe episode occurred one night after exercising and during dinner, with her heart rate increasing while sitting and eating, lasting for a couple of hours. Most episodes last between 15 to 30 minutes, with the longest recorded episode on a heart monitor being 34 minutes. She finds these episodes concerning as she cannot stop them once they start.  She has been on metoprolol , which has helped to some extent in controlling the episodes, but it has not completely resolved the issue. She has not experienced any episodes that have led to syncope or required emergency medical services.  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   Review of systems complete and found to be negative unless listed in HPI.   EP Information / Studies Reviewed:    EKG is ordered today. Personal review as below.  EKG Interpretation Date/Time:  Tuesday September 10 2024 15:03:26 EDT Ventricular Rate:  60 PR Interval:  150 QRS Duration:  90 QT Interval:  408 QTC Calculation: 408 R Axis:   50  Text Interpretation: Normal sinus  rhythm with subtle pre-excitation / delta wave When compared with ECG of 04-Apr-2018 17:47,  Delta wave more prominent today Confirmed by Kitty Fonda [phone]) on 09/11/2024 10:08:01 PM   Zio 08/22/24: Patient had a minimum heart rate of 47 bpm, maximum heart rate of 171 bpm, and average heart rate of 76 bpm.  Predominant underlying rhythm was sinus rhythm.  One episode of symptomatic regular SVT; 34 minutes, average rate of 151 bpm.  Isolated PACs were rare (<1.0%).  Isolated PVCs were rare (<1.0%).  Triggered and diary events associated with SVT as above and sinus rhythm.   Symptomatic, sustained, SVT (one episode).    CT Cardiac Scoring 09/11/23:  Impression: Coronary calcium  score of 60.6. Percent ranking not calculated due to unspecified racial group, however, this score equates to the 78-83rd percentile for all races.       Physical Exam:   VS:  BP 120/80 (BP Location: Left Arm, Patient Position: Sitting, Cuff Size: Normal)   Pulse 60   Ht 5' (1.524 m)   Wt 121 lb 3.2 oz (55 kg)   SpO2 98%   BMI 23.67 kg/m    Wt Readings from Last 3 Encounters:  09/10/24 121 lb 3.2 oz (55 kg)  05/31/23 123 lb (55.8 kg)  11/17/22 119 lb 9.6 oz (54.3 kg)     GEN: Well nourished, well developed in no acute distress NECK: No JVD CARDIAC: Normal rate, regular rhythm RESPIRATORY:  Clear to auscultation without rales, wheezing or rhonchi  ABDOMEN: Soft, non-distended EXTREMITIES:  No edema; No deformity  ASSESSMENT AND PLAN:    #SVT: Symptomatic and sustained.  Appears to have subtle preexcitation on her baseline ECG.  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 #Palpitations -She continues to have SVT episodes despite metoprolol .  She has increased this over the years.  We discussed escalation to antiarrhythmic drug therapy with flecainide for suppression versus EP study and catheter ablation.  Risk, benefits of EP study and radiofrequency ablation for SVT were also discussed in detail  today. These risks include but are not limited to complete heart block, stroke, bleeding, vascular damage, tamponade, perforation, and death. The patient understands these risk and wishes to proceed.  We will therefore proceed with catheter ablation at the next available time.  Carto and ICE are requested for the procedure.    Follow up with Dr. Kennyth 3 months after ablation.   Signed, Fonda Kennyth, MD

## 2024-09-17 NOTE — Progress Notes (Unsigned)
 Cardiology Office Note:  .    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    Katrina Warren is a 63 y.o. 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   Relevant histories: .  Social *** ROS: As per HPI.   Studies Reviewed: .     Cardiac Studies & Procedures   ______________________________________________________________________________________________   STRESS TESTS  ECHOCARDIOGRAM STRESS TEST 05/11/2018  Narrative *[facility]* [address] [place], [place] [address] [phone]  ------------------------------------------------------------------- Stress Echocardiography  Patient:    Katrina, Warren MR #:       [id number] Study Date: 05/11/2018 Gender:     F Age:        75 Height:     153.7 cm Weight:     58.4 kg BSA:        1.59 m^2 Pt. Status: Room:  BARTON Delice Race [id number] ATTENDING    Jennifer Crape, MD ORDERING     Jennifer Crape, MD REFERRING    Jennifer Crape, MD PERFORMING   [facility] [place] [facility] SONOGRAPHER  Jimmy Reel, RDCS  cc:  -------------------------------------------------------------------  ------------------------------------------------------------------- Indications:      Chest pain 786.51.  ------------------------------------------------------------------- History:   PMH:  No prior cardiac history.  ------------------------------------------------------------------- Study  Conclusions  - Stress ECG conclusions: There were no stress arrhythmias or conduction abnormalities. The stress ECG was negative for ischemia. - Staged echo: There was no echocardiographic evidence for stress-induced ischemia.  Impressions:  - Good exercise tolerance. No chest pain during the exercise. NO evidence of ischemia base on ecg or Echo criteria. Overall NEGATIVE stress echo for exercise induced ischemia.  ------------------------------------------------------------------- Study data:   Study status:  Routine.  Consent:  The risks, benefits, and alternatives to the procedure were explained to the patient and informed consent was obtained.  Procedure:  The patient reported no pain pre or post test. Initial setup. The patient was brought to the laboratory. A baseline ECG was recorded. Surface ECG leads and automatic cuff blood pressure measurements were monitored. Treadmill exercise testing was performed using the Bruce protocol. The patient exercised for 10 min 2 sec, to a maximal work rate of 13.4 mets. Exercise was terminated due to achievement of target heart rate. The patient was positioned for image acquisition and recovery monitoring. Transthoracic stress echocardiography for chest pain evaluation. Image quality was good. Images were captured at baseline and peak exercise.  Study completion:  The patient tolerated the procedure well. There were no complications. Bruce protocol. Stress echocardiography.  Birthdate:  Patient birthdate: Apr 05, 1961.  Age:  Patient is 63 yr old.  Sex:  Gender: female.    BMI: 24.7 kg/m^2.  Blood pressure:     112/72  Patient status:  Outpatient.  Study date:  Study date: 05/11/2018. Study time: 02:10 PM.  -------------------------------------------------------------------  ------------------------------------------------------------------- Mitral valve:   Doppler:     Peak gradient (D): 3 mm  Hg.  ------------------------------------------------------------------- Baseline ECG:  Normal.  ------------------------------------------------------------------- Stress protocol:  +--------+---------------+---+-----------+--------+ !Stage   !Time into phase!HR !BP (mmHg)  !Symptoms! +--------+---------------+---+-----------+--------+ !Baseline!---------------!77 !118/78 (91)!None    ! +--------+---------------+---+-----------+--------+ !Stage 1 !---------------!876!870/35 (86)!--------! +--------+---------------+---+-----------+--------+ !Stage 2 !---------------!863!877/33 (85)!--------! +--------+---------------+---+-----------+--------+ !Stage 3 !---------------!141!151/68 (96)!--------! +--------+---------------+---+-----------+--------+ !Stage 4 !---------------!151!-----------!--------! +--------+---------------+---+-----------+--------+ !Recovery!3 min 28 sec   !83 !130/73 (92)!--------! +--------+---------------+---+-----------+--------+  ------------------------------------------------------------------- Stress results:   Maximal heart rate during stress was 151 bpm (94% of maximal predicted heart rate). The maximal predicted heart rate was 161 bpm.The target heart rate was achieved. The heart rate response to stress was normal. There was a normal resting blood pressure with an appropriate response to stress. The rate-pressure product for the peak heart rate and blood pressure was 78708 mm Hg/min.  The patient experienced no chest pain during stress.  ------------------------------------------------------------------- Stress ECG:  There were no stress arrhythmias or conduction abnormalities.  The stress ECG was negative for ischemia.  ------------------------------------------------------------------- Baseline:  - The estimated LV ejection fraction was 55%. - Normal wall motion; no LV regional wall motion abnormalities.  - The estimated LV ejection fraction was  60%. - Normal wall motion; no LV regional wall motion abnormalities.  ------------------------------------------------------------------- Stress echo results:     Left ventricular ejection fraction was normal at rest and with stress. There was no echocardiographic evidence for stress-induced ischemia.  ------------------------------------------------------------------- Measurements  Left ventricle                         Value        Reference LV ID, ED, PLAX chordal                45.3  mm     43 - 52 LV ID, ES, PLAX chordal                32.6  mm     23 - 38 LV fx shortening, PLAX chordal (L)     28    %      >=29 LV PW thickness, ED                    9.76  mm     --------- IVS/LV PW ratio, ED                    0.87         <=1.3 LV e&', lateral                         10.1  cm/s   --------- LV E/e&', lateral                       8.41         --------- LV e&', medial                          9.03  cm/s   --------- LV E/e&', medial                        9.4          --------- LV e&', average                         9.57  cm/s   --------- LV E/e&', average  8.88         ---------  Ventricular septum                     Value        Reference IVS thickness, ED                      8.52  mm     ---------  LVOT                                   Value        Reference LVOT ID, S                             18    mm     --------- LVOT area                              2.54  cm^2   ---------  Aorta                                  Value        Reference Aortic root ID, ED                     31    mm     --------- Ascending aorta ID, A-P, S             28    mm     ---------  Left atrium                            Value        Reference LA ID, A-P, ES                         34    mm     --------- LA ID/bsa, A-P                         2.14  cm/m^2 <=2.2 LA volume, S                           44.4  ml     --------- LA volume/bsa, S                       27.9   ml/m^2 --------- LA volume, ES, 1-p A4C                 43.7  ml     --------- LA volume/bsa, ES, 1-p A4C             27.5  ml/m^2 --------- LA volume, ES, 1-p A2C                 45    ml     --------- LA volume/bsa, ES, 1-p A2C             28.3  ml/m^2 ---------  Mitral valve  Value        Reference Mitral E-wave peak velocity            84.9  cm/s   --------- Mitral A-wave peak velocity            59.2  cm/s   --------- Mitral deceleration time       (H)     349   ms     150 - 230 Mitral peak gradient, D                3     mm Hg  --------- Mitral E/A ratio, peak                 1.4          ---------  Tricuspid valve                        Value        Reference Tricuspid regurg peak velocity         236   cm/s   --------- Tricuspid peak RV-RA gradient          22    mm Hg  ---------  Legend: (L)  and  (H)  mark values outside specified reference range.  ------------------------------------------------------------------- Prepared and Electronically Authenticated by  Lamar Fitch, MD 2019-05-24T17:19:02   ECHOCARDIOGRAM  ECHOCARDIOGRAM COMPLETE 11/01/2021  Narrative ECHOCARDIOGRAM REPORT    Patient Name:   Katrina Warren Date of Exam: 11/01/2021 Medical Rec #:  [id number]         Height:       60.5 in Accession #:    [id number]        Weight:       121.2 lb Date of Birth:  November 03, 1961          BSA:          1.518 m Patient Age:    60 years          BP:           110/72 mmHg Patient Gender: F                 HR:           59 bpm. Exam Location:  [address]  Procedure: 2D Echo, 3D Echo, Cardiac Doppler, Color Doppler and Strain Analysis  Indications:    I47.1 PSVT  History:        Patient has prior history of Echocardiogram examinations, most recent 05/11/2018.  Sonographer:    Carl Coma RDCS Referring Phys: [phone] [facility]  IMPRESSIONS   1. Left ventricular ejection fraction, by estimation, is 60 to 65%. Left  ventricular ejection fraction by 3D volume is 62 %. The left ventricle has normal function. The left ventricle has no regional wall motion abnormalities. Left ventricular diastolic parameters were normal. The average left ventricular global longitudinal strain is -25.5 %. The global longitudinal strain is normal. 2. Right ventricular systolic function is normal. The right ventricular size is normal. There is normal pulmonary artery systolic pressure. The estimated right ventricular systolic pressure is 27.8 mmHg. 3. No prolapse. The mitral valve is abnormal, demonstrating bileaflet thickening. Trivial central mitral valve regurgitation. 4. Tricsupid leaflet thickening is noted. 5. The aortic valve is tricuspid. Aortic valve regurgitation is not visualized. 6. The inferior vena cava is normal in size with greater than 50% respiratory variability, suggesting right atrial pressure of 3 mmHg.  Comparison(s):  Changes from prior study are noted. 05/11/2018 (stress echo) - LVEF at rest was 55%.  FINDINGS Left Ventricle: Left ventricular ejection fraction, by estimation, is 60 to 65%. Left ventricular ejection fraction by 3D volume is 62 %. The left ventricle has normal function. The left ventricle has no regional wall motion abnormalities. The average left ventricular global longitudinal strain is -25.5 %. The global longitudinal strain is normal. The left ventricular internal cavity size was normal in size. There is no left ventricular hypertrophy. Left ventricular diastolic parameters were normal.  Right Ventricle: The right ventricular size is normal. No increase in right ventricular wall thickness. Right ventricular systolic function is normal. There is normal pulmonary artery systolic pressure. The tricuspid regurgitant velocity is 2.49 m/s, and with an assumed right atrial pressure of 3 mmHg, the estimated right ventricular systolic pressure is 27.8 mmHg.  Left Atrium: Left atrial size was normal in  size.  Right Atrium: Right atrial size was normal in size.  Pericardium: There is no evidence of pericardial effusion.  Mitral Valve: No prolapse. The mitral valve is abnormal. There is mild thickening of the anterior and posterior mitral valve leaflet(s). Trivial mitral valve regurgitation.  Tricuspid Valve: Tricsupid leaflet thickening is noted. The tricuspid valve is grossly normal. Tricuspid valve regurgitation is mild.  Aortic Valve: The aortic valve is tricuspid. Aortic valve regurgitation is not visualized.  Pulmonic Valve: The pulmonic valve was normal in structure. Pulmonic valve regurgitation is not visualized.  Aorta: The aortic root and ascending aorta are structurally normal, with no evidence of dilitation.  Venous: The inferior vena cava is normal in size with greater than 50% respiratory variability, suggesting right atrial pressure of 3 mmHg.  IAS/Shunts: No atrial level shunt detected by color flow Doppler.   LEFT VENTRICLE PLAX 2D LVIDd:         4.70 cm         Diastology LVIDs:         2.90 cm         LV e' medial:    9.68 cm/s LV PW:         0.70 cm         LV E/e' medial:  9.9 LV IVS:        0.70 cm         LV e' lateral:   11.40 cm/s LVOT diam:     1.70 cm         LV E/e' lateral: 8.4 LV SV:         39 LV SV Index:   26              2D LVOT Area:     2.27 cm        Longitudinal Strain 2D Strain GLS  -25.1 % (A2C): 2D Strain GLS  -25.9 % (A3C): 2D Strain GLS  -25.5 % (A4C): 2D Strain GLS  -25.5 % Avg:  3D Volume EF LV 3D EF:    Left ventricul ar ejection fraction by 3D volume is 62 %.  3D Volume EF: 3D EF:        62 % LV EDV:       101 ml LV ESV:       38 ml LV SV:        63 ml  RIGHT VENTRICLE             IVC RV Basal diam:  4.10 cm     IVC diam: 1.90 cm RV  S prime:     17.70 cm/s TAPSE (M-mode): 2.2 cm  LEFT ATRIUM             Index        RIGHT ATRIUM           Index LA diam:        3.50 cm 2.31 cm/m   RA Area:     10.70 cm LA  Vol (A2C):   50.5 ml 33.27 ml/m  RA Volume:   23.80 ml  15.68 ml/m LA Vol (A4C):   26.1 ml 17.19 ml/m LA Biplane Vol: 37.7 ml 24.83 ml/m AORTIC VALVE             PULMONIC VALVE LVOT Vmax:   74.95 cm/s  PV Vmax:       0.94 m/s LVOT Vmean:  49.000 cm/s PV Peak grad:  3.5 mmHg LVOT VTI:    0.174 m  AORTA Ao Root diam: 3.10 cm Ao Asc diam:  2.90 cm  MITRAL VALVE               TRICUSPID VALVE MV Area (PHT): 4.60 cm    TR Peak grad:   24.8 mmHg MV Decel Time: 165 msec    TR Vmax:        249.00 cm/s MV E velocity: 95.90 cm/s MV A velocity: 65.50 cm/s  SHUNTS MV E/A ratio:  1.46        Systemic VTI:  0.17 m Systemic Diam: 1.70 cm  Vinie Maxcy MD Electronically signed by Vinie Maxcy MD Signature Date/Time: 11/01/2021/11:13:50 AM    Final    MONITORS  LONG TERM MONITOR (3-14 DAYS) 08/22/2024  Narrative   Patient had a minimum heart rate of 47 bpm, maximum heart rate of 171 bpm, and average heart rate of 76 bpm. Predominant underlying rhythm was sinus rhythm. One episode of symptomatic regular SVT; 34 minutes, average rate of 151 bpm. Isolated PACs were rare (<1.0%). Isolated PVCs were rare (<1.0%). Triggered and diary events associated with SVT as above and sinus rhythm.  Symptomatic, sustained, SVT (one episode).   CT SCANS  CT CARDIAC SCORING (SELF PAY ONLY) 09/11/2023  Addendum 09/29/2023  8:52 AM ADDENDUM REPORT: 09/29/2023 08:49  EXAM: OVER-READ INTERPRETATION  CT CHEST  The following report is an over-read performed by radiologist Dr. Toribio Cove [facility] on 09/29/2023. This over-read does not include interpretation of cardiac or coronary anatomy or pathology. The coronary calcium  score interpretation by the cardiologist is attached.  COMPARISON:  None.  FINDINGS: Atherosclerotic calcifications in the thoracic aorta. Within the visualized portions of the thorax there are no suspicious appearing pulmonary nodules or masses, there  is no acute consolidative airspace disease, no pleural effusions, no pneumothorax and no lymphadenopathy. Visualized portions of the upper abdomen are unremarkable. There are no aggressive appearing lytic or blastic lesions noted in the visualized portions of the skeleton.  IMPRESSION: 1. Aortic Atherosclerosis (ICD10-I70.0).   Electronically Signed By: Toribio Aye M.D. On: 09/29/2023 08:49  Narrative CLINICAL DATA:  Cardiovascular Disease Risk stratification  EXAM: Coronary Calcium  Score  TECHNIQUE: A gated, non-contrast computed tomography scan of the heart was performed using 3 mm slice thickness. Axial images were analyzed on a dedicated workstation. Calcium  scoring of the coronary arteries was performed using the Agatston method.  FINDINGS: Coronary arteries: Normal origins.  Coronary Calcium  Score:  Left main: 0  Left anterior descending artery: 60.6  Left circumflex artery: 0  Right coronary artery: 0  Total:  60.6  Percentile: Not calculated  Pericardium: Normal.  Aorta: Normal caliber.  Non-cardiac: See separate report from [facility].  IMPRESSION: Coronary calcium  score of 60.6. Percent ranking not calculated due to unspecified racial group, however, this score equates to the 78-83rd percentile for all races.  RECOMMENDATIONS: Coronary artery calcium  (CAC) score is a strong predictor of incident coronary heart disease (CHD) and provides predictive information beyond traditional risk factors. CAC scoring is reasonable to use in the decision to withhold, postpone, or initiate statin therapy in intermediate-risk or selected borderline-risk asymptomatic adults (age 57-75 years and LDL-C >=70 to <190 mg/dL) who do not have diabetes or established atherosclerotic cardiovascular disease (ASCVD).* In intermediate-risk (10-year ASCVD risk >=7.5% to <20%) adults or selected borderline-risk (10-year ASCVD risk >=5% to <7.5%) adults in whom  a CAC score is measured for the purpose of making a treatment decision the following recommendations have been made:  If CAC=0, it is reasonable to withhold statin therapy and reassess in 5 to 10 years, as long as higher risk conditions are absent (diabetes mellitus, family history of premature CHD in first degree relatives (males <55 years; females <65 years), cigarette smoking, or LDL >=190 mg/dL).  If CAC is 1 to 99, it is reasonable to initiate statin therapy for patients >=73 years of age.  If CAC is >=100 or >=75th percentile, it is reasonable to initiate statin therapy at any age.  Cardiology referral should be considered for patients with CAC scores >=400 or >=75th percentile.  *2018 AHA/ACC/AACVPR/AAPA/ABC/ACPM/ADA/AGS/APhA/ASPC/NLA/PCNA Guideline on the Management of Blood Cholesterol: A Report of the American College of Cardiology/American Heart Association Task Force on Clinical Practice Guidelines. J Am Coll Cardiol. 2019;73(24):3168-3209.  Vinie Maxcy, MD  Electronically Signed: By: Vinie JAYSON Maxcy M.D. On: 09/11/2023 12:40     ______________________________________________________________________________________________        Risk Assessment/Calculations:    {Does this patient have ATRIAL FIBRILLATION?:[phone]}       Physical Exam:    VS:  There were no vitals taken for this visit.   Wt Readings from Last 3 Encounters:  09/10/24 121 lb 3.2 oz (55 kg)  05/31/23 123 lb (55.8 kg)  11/17/22 119 lb 9.6 oz (54.3 kg)    Gen: *** distress, *** obese/well nourished/malnourished   Neck: No JVD, *** carotid bruit Ears: *** Frank Sign Cardiac: No Rubs or Gallops, *** Murmur, ***cardia, *** radial pulses Respiratory: Clear to auscultation bilaterally, *** effort, ***  respiratory rate GI: Soft, nontender, non-distended *** MS: No *** edema; *** moves all extremities Integument: Skin feels *** Neuro:  At time of evaluation, alert and oriented to  person/place/time/situation *** Psych: Normal affect, patient feels ***   ASSESSMENT AND PLAN: .    *** An EKG was ordered for *** and shows ***  Stanly Leavens, MD FASE [facility] Cardiologist [facility]  [address], [address] [place], [place] [address] [phone]  8:46 AM

## 2024-09-18 ENCOUNTER — Other Ambulatory Visit: Payer: Self-pay | Admitting: *Deleted

## 2024-09-18 ENCOUNTER — Ambulatory Visit: Attending: Internal Medicine | Admitting: Internal Medicine

## 2024-09-18 VITALS — BP 115/76 | HR 62 | Ht 60.0 in | Wt 122.0 lb

## 2024-09-18 DIAGNOSIS — E782 Mixed hyperlipidemia: Secondary | ICD-10-CM | POA: Diagnosis not present

## 2024-09-18 DIAGNOSIS — I340 Nonrheumatic mitral (valve) insufficiency: Secondary | ICD-10-CM

## 2024-09-18 DIAGNOSIS — I2510 Atherosclerotic heart disease of native coronary artery without angina pectoris: Secondary | ICD-10-CM | POA: Diagnosis not present

## 2024-09-18 NOTE — Patient Instructions (Signed)
 Medication Instructions:  Your physician recommends that you continue on your current medications as directed. Please refer to the Current Medication list given to you today.  *If you need a refill on your cardiac medications before your next appointment, please call your pharmacy*  Lab Work: Have lab work done when you have pre procedure labs done--LDL direct and Lp(a) If you have labs (blood work) drawn today and your tests are completely normal, you will receive your results only by: MyChart Message (if you have MyChart) OR A paper copy in the mail If you have any lab test that is abnormal or we need to change your treatment, we will call you to review the results.  Testing/Procedures: Your physician has requested that you have an echocardiogram. Echocardiography is a painless test that uses sound waves to create images of your heart. It provides your doctor with information about the size and shape of your heart and how well your heart's chambers and valves are working. This procedure takes approximately one hour. There are no restrictions for this procedure. Please do NOT wear cologne, perfume, aftershave, or lotions (deodorant is allowed). Please arrive 15 minutes prior to your appointment time.  Please note: We ask at that you not bring children with you during ultrasound (echo/ vascular) testing. Due to room size and safety concerns, children are not allowed in the ultrasound rooms during exams. Our front office staff cannot provide observation of children in our lobby area while testing is being conducted. An adult accompanying a patient to their appointment will only be allowed in the ultrasound room at the discretion of the ultrasound technician under special circumstances. We apologize for any inconvenience.   Follow-Up: At [facility], you and your health needs are our priority.  As part of our continuing mission to provide you with exceptional heart care, our providers  are all part of one team.  This team includes your primary Cardiologist (physician) and Advanced Practice Providers or APPs (Physician Assistants and Nurse Practitioners) who all work together to provide you with the care you need, when you need it.  Your next appointment:   12 month(s)  Provider:   Dr Santo   We recommend signing up for the patient portal called MyChart.  Sign up information is provided on this After Visit Summary.  MyChart is used to connect with patients for Virtual Visits (Telemedicine).  Patients are able to view lab/test results, encounter notes, upcoming appointments, etc.  Non-urgent messages can be sent to your provider as well.   To learn more about what you can do with MyChart, go to [email or web address].   Other Instructions

## 2024-10-15 ENCOUNTER — Ambulatory Visit: Admitting: Podiatry

## 2024-10-23 ENCOUNTER — Encounter: Payer: Self-pay | Admitting: Podiatry

## 2024-10-23 ENCOUNTER — Ambulatory Visit: Admitting: Podiatry

## 2024-10-23 ENCOUNTER — Ambulatory Visit (INDEPENDENT_AMBULATORY_CARE_PROVIDER_SITE_OTHER)

## 2024-10-23 VITALS — Ht 60.0 in | Wt 122.0 lb

## 2024-10-23 DIAGNOSIS — M722 Plantar fascial fibromatosis: Secondary | ICD-10-CM

## 2024-10-23 DIAGNOSIS — L740 Miliaria rubra: Secondary | ICD-10-CM | POA: Diagnosis not present

## 2024-10-23 DIAGNOSIS — M21622 Bunionette of left foot: Secondary | ICD-10-CM

## 2024-10-23 DIAGNOSIS — M779 Enthesopathy, unspecified: Secondary | ICD-10-CM | POA: Diagnosis not present

## 2024-10-23 DIAGNOSIS — M21621 Bunionette of right foot: Secondary | ICD-10-CM | POA: Diagnosis not present

## 2024-10-23 DIAGNOSIS — D229 Melanocytic nevi, unspecified: Secondary | ICD-10-CM | POA: Diagnosis not present

## 2024-10-23 DIAGNOSIS — L820 Inflamed seborrheic keratosis: Secondary | ICD-10-CM | POA: Diagnosis not present

## 2024-10-23 DIAGNOSIS — L578 Other skin changes due to chronic exposure to nonionizing radiation: Secondary | ICD-10-CM | POA: Diagnosis not present

## 2024-10-23 DIAGNOSIS — D1801 Hemangioma of skin and subcutaneous tissue: Secondary | ICD-10-CM | POA: Diagnosis not present

## 2024-10-23 DIAGNOSIS — L821 Other seborrheic keratosis: Secondary | ICD-10-CM | POA: Diagnosis not present

## 2024-10-23 DIAGNOSIS — L814 Other melanin hyperpigmentation: Secondary | ICD-10-CM | POA: Diagnosis not present

## 2024-10-23 NOTE — Progress Notes (Signed)
 Subjective:   Patient ID: Katrina Warren, female   DOB: 63 y.o.   MRN: [id number]   HPI Patient has developed discomfort in both the heel and forefoot both feet and states that she walks a lot plays pickle ball rides a bike and is having increased discomfort associated with these conditions   ROS      Objective:  Physical Exam  Neurovascular status found to be intact muscle strength range of motion adequate.  Patient does have mild to moderate discomfort around the lesser MPJs right over left foot with moderate reduction of motion of the first MPJ right.  Patient is found to have good digital perfusion well-oriented x 3     Assessment:  Inflammatory capsulitis moderate grade bilateral plantar fascial inflammation bilateral and discomfort with some swelling around the first MPJ right     Plan:  H&P all conditions reviewed.  At this point I did discuss possibility for acute treatment but we are gena try orthotics to offload weight from the forefoot and heel and then may have to treat in other fashion.  I did review condition and x-ray reappoint to recheck after orthotics with patient being seen by pedorthist today who agrees with diagnosis and casting done  X-rays indicate that there is elongation of the first metatarsal segment right over left with narrowness of the first MPJ joint

## 2024-10-30 ENCOUNTER — Ambulatory Visit (HOSPITAL_COMMUNITY)
Admission: RE | Admit: 2024-10-30 | Discharge: 2024-10-30 | Disposition: A | Discharge status: Home / Self Care | Source: Ambulatory Visit | Attending: Internal Medicine | Admitting: Internal Medicine

## 2024-10-30 DIAGNOSIS — I340 Nonrheumatic mitral (valve) insufficiency: Secondary | ICD-10-CM

## 2024-10-30 DIAGNOSIS — I2510 Atherosclerotic heart disease of native coronary artery without angina pectoris: Secondary | ICD-10-CM

## 2024-10-30 LAB — ECHOCARDIOGRAM COMPLETE: S' Lateral: 3.36 cm

## 2024-10-31 ENCOUNTER — Ambulatory Visit: Payer: Self-pay | Admitting: Internal Medicine

## 2024-10-31 DIAGNOSIS — E782 Mixed hyperlipidemia: Secondary | ICD-10-CM

## 2024-10-31 LAB — LIPOPROTEIN A (LPA): Lipoprotein (a): 195.8 nmol/L — ABNORMAL HIGH (ref <75.0)

## 2024-10-31 LAB — LDL CHOLESTEROL, DIRECT: LDL Direct: 98 mg/dL (ref 0–99)

## 2024-11-04 ENCOUNTER — Encounter (HOSPITAL_COMMUNITY): Payer: Self-pay

## 2024-11-04 ENCOUNTER — Telehealth (HOSPITAL_COMMUNITY): Payer: Self-pay

## 2024-11-04 ENCOUNTER — Other Ambulatory Visit (HOSPITAL_COMMUNITY): Payer: Self-pay

## 2024-11-04 DIAGNOSIS — Z01812 Encounter for preprocedural laboratory examination: Secondary | ICD-10-CM

## 2024-11-04 DIAGNOSIS — I471 Supraventricular tachycardia, unspecified: Secondary | ICD-10-CM

## 2024-11-04 NOTE — Telephone Encounter (Signed)
 Spoke with patient to discuss upcoming procedure.   Labs: complete by 11/24.   Any recent signs of acute illness or been started on antibiotics? No Any new medications started?No Any medications to hold? HOLD Metoprolol  for 3 days prior to your procedure. Last dose on December 4.  Medication instructions:  On the morning of your procedure DO NOT take any medication. or the procedure may be rescheduled. Nothing to eat or drink after midnight prior to your procedure.  Confirmed patient is scheduled for SVT Ablation on Monday, December 8 with Dr. Dr. Kennyth. Instructed patient to arrive at the Main Entrance A at [facility]: [address] [place], [place] [address] and check in at Admitting at 10:30 AM.   Advised of plan to go home the same day and will only stay overnight if medically necessary. You MUST have a responsible adult to drive you home and MUST be with you the first 24 hours after you arrive home or your procedure could be cancelled.  Informed patient a nurse will call a day before the procedure to confirm arrival time and ensure instructions are followed.  Patient verbalized understanding to all instructions provided and agreed to proceed with procedure.   Advised patient to contact RN Navigator at [phone], to inform of any new medications started after call or concerns prior to procedure.

## 2024-11-05 DIAGNOSIS — I471 Supraventricular tachycardia, unspecified: Secondary | ICD-10-CM | POA: Diagnosis not present

## 2024-11-05 DIAGNOSIS — Z01812 Encounter for preprocedural laboratory examination: Secondary | ICD-10-CM | POA: Diagnosis not present

## 2024-11-06 LAB — BASIC METABOLIC PANEL WITH GFR
BUN/Creatinine Ratio: 11 — ABNORMAL LOW (ref 12–28)
BUN: 8 mg/dL (ref 8–27)
CO2: 26 mmol/L (ref 20–29)
Calcium: 9.2 mg/dL (ref 8.7–10.3)
Chloride: 97 mmol/L (ref 96–106)
Creatinine, Ser: 0.76 mg/dL (ref 0.57–1.00)
Glucose: 92 mg/dL (ref 70–99)
Potassium: 4.9 mmol/L (ref 3.5–5.2)
Sodium: 136 mmol/L (ref 134–144)
eGFR: 87 mL/min/1.73 (ref >59)

## 2024-11-06 LAB — CBC
Hematocrit: 38.3 % (ref 34.0–46.6)
Hemoglobin: 12.3 g/dL (ref 11.1–15.9)
MCH: 31.3 pg (ref 26.6–33.0)
MCHC: 32.1 g/dL (ref 31.5–35.7)
MCV: 98 fL — ABNORMAL HIGH (ref 79–97)
Platelets: 231 x10E3/uL (ref 150–450)
RBC: 3.93 x10E6/uL (ref 3.77–5.28)
RDW: 11.3 % — ABNORMAL LOW (ref 11.7–15.4)
WBC: 4.4 x10E3/uL (ref 3.4–10.8)

## 2024-11-06 MED ORDER — ROSUVASTATIN CALCIUM 20 MG PO TABS: 20.0000 mg | ORAL_TABLET | Freq: Every day | ORAL | 3 refills | Status: AC

## 2024-11-10 ENCOUNTER — Ambulatory Visit: Payer: Self-pay | Admitting: Cardiology

## 2024-11-13 NOTE — Addendum Note (Signed)
 Addended by: Trayonna Bachmeier W on: 11/13/2024 02:44 PM   Modules accepted: Orders

## 2024-11-22 NOTE — Progress Notes (Signed)
 Spoke with patient regarding change in procedure start time.  Patient is agreeable and will arrive at hospital at 8:00 AM Monday for 10:00 AM procedure.  Katrina Warren B

## 2024-11-24 NOTE — Pre-Procedure Instructions (Signed)
 Attempted to call patient regarding procedure instructions for tomorrow.  Left voicemail on the following items: Arrival time 0800 Nothing to eat or drink after midnight No meds AM of procedure Responsible person to drive you home and stay with you for 24 hrs  Metoprolol - last dose should have been Thursday 12/4.

## 2024-11-25 ENCOUNTER — Ambulatory Visit (HOSPITAL_COMMUNITY): Admitting: Anesthesiology

## 2024-11-25 ENCOUNTER — Encounter (HOSPITAL_COMMUNITY): Payer: Self-pay | Admitting: Cardiology

## 2024-11-25 ENCOUNTER — Telehealth: Payer: Self-pay

## 2024-11-25 ENCOUNTER — Ambulatory Visit (HOSPITAL_COMMUNITY): Admission: RE | Disposition: A | Payer: Self-pay | Source: Home / Self Care | Attending: Cardiology

## 2024-11-25 ENCOUNTER — Ambulatory Visit (HOSPITAL_COMMUNITY)
Admission: RE | Admit: 2024-11-25 | Discharge: 2024-11-25 | Disposition: A | Attending: Cardiology | Admitting: Cardiology

## 2024-11-25 ENCOUNTER — Other Ambulatory Visit: Payer: Self-pay

## 2024-11-25 DIAGNOSIS — R002 Palpitations: Secondary | ICD-10-CM | POA: Insufficient documentation

## 2024-11-25 DIAGNOSIS — I471 Supraventricular tachycardia, unspecified: Secondary | ICD-10-CM | POA: Diagnosis not present

## 2024-11-25 DIAGNOSIS — Z79899 Other long term (current) drug therapy: Secondary | ICD-10-CM | POA: Insufficient documentation

## 2024-11-25 DIAGNOSIS — I4719 Other supraventricular tachycardia: Secondary | ICD-10-CM | POA: Insufficient documentation

## 2024-11-25 HISTORY — PX: SVT ABLATION: EP1225

## 2024-11-25 LAB — POCT ACTIVATED CLOTTING TIME: Activated Clotting Time: 307 s

## 2024-11-25 SURGERY — SVT ABLATION
Anesthesia: Monitor Anesthesia Care

## 2024-11-25 MED ORDER — ASPIRIN 325 MG PO TBEC: 325.0000 mg | DELAYED_RELEASE_TABLET | Freq: Once | ORAL | Status: DC

## 2024-11-25 MED ORDER — PROPOFOL 10 MG/ML IV BOLUS
INTRAVENOUS | Status: DC | PRN
  Administered 2024-11-25: 5 mg via INTRAVENOUS
  Administered 2024-11-25: 30 mg via INTRAVENOUS

## 2024-11-25 MED ORDER — MIDAZOLAM HCL 2 MG/2ML IJ SOLN
INTRAMUSCULAR | Status: AC
  Filled 2024-11-25: qty 2

## 2024-11-25 MED ORDER — PROPOFOL 500 MG/50ML IV EMUL
INTRAVENOUS | Status: DC | PRN
  Administered 2024-11-25: 80 ug/kg/min via INTRAVENOUS

## 2024-11-25 MED ORDER — ACETAMINOPHEN 325 MG PO TABS: 650.0000 mg | ORAL_TABLET | ORAL | Status: DC | PRN

## 2024-11-25 MED ORDER — BUPIVACAINE HCL (PF) 0.25 % IJ SOLN
INTRAMUSCULAR | Status: AC
  Filled 2024-11-25: qty 30

## 2024-11-25 MED ORDER — PROTAMINE SULFATE 10 MG/ML IV SOLN
INTRAVENOUS | Status: DC | PRN
  Administered 2024-11-25: 35 mg via INTRAVENOUS

## 2024-11-25 MED ORDER — SODIUM CHLORIDE 0.9% FLUSH: 3.0000 mL | Freq: Two times a day (BID) | INTRAVENOUS | Status: DC

## 2024-11-25 MED ORDER — SODIUM CHLORIDE 0.9% FLUSH: 3.0000 mL | INTRAVENOUS | Status: DC | PRN

## 2024-11-25 MED ORDER — HEPARIN SODIUM (PORCINE) 1000 UNIT/ML IJ SOLN
INTRAMUSCULAR | Status: DC | PRN
  Administered 2024-11-25: 1000 [IU] via INTRAVENOUS
  Administered 2024-11-25: 12000 [IU] via INTRAVENOUS

## 2024-11-25 MED ORDER — PHENYLEPHRINE HCL-NACL 20-0.9 MG/250ML-% IV SOLN
INTRAVENOUS | Status: DC | PRN
  Administered 2024-11-25: 10 ug/min via INTRAVENOUS

## 2024-11-25 MED ORDER — LACTATED RINGERS IV SOLN: INTRAVENOUS | Status: DC | PRN

## 2024-11-25 MED ORDER — FENTANYL CITRATE (PF) 100 MCG/2ML IJ SOLN
INTRAMUSCULAR | Status: AC
  Filled 2024-11-25: qty 2

## 2024-11-25 MED ORDER — HEPARIN (PORCINE) IN NACL 1000-0.9 UT/500ML-% IV SOLN
INTRAVENOUS | Status: DC | PRN
  Administered 2024-11-25 (×2): 500 mL

## 2024-11-25 MED ORDER — SODIUM CHLORIDE 0.9 % IV SOLN: 250.0000 mL | INTRAVENOUS | Status: DC | PRN

## 2024-11-25 MED ORDER — FENTANYL CITRATE (PF) 100 MCG/2ML IJ SOLN
INTRAMUSCULAR | Status: DC | PRN
  Administered 2024-11-25 (×2): 50 ug via INTRAVENOUS
  Administered 2024-11-25: 100 ug via INTRAVENOUS

## 2024-11-25 MED ORDER — MIDAZOLAM HCL (PF) 2 MG/2ML IJ SOLN
INTRAMUSCULAR | Status: DC | PRN
  Administered 2024-11-25: 2 mg via INTRAVENOUS
  Administered 2024-11-25 (×2): 1 mg via INTRAVENOUS

## 2024-11-25 MED ORDER — SODIUM CHLORIDE 0.9 % IV SOLN: INTRAVENOUS | Status: DC

## 2024-11-25 MED ORDER — ONDANSETRON HCL 4 MG/2ML IJ SOLN: 4.0000 mg | Freq: Four times a day (QID) | INTRAMUSCULAR | Status: DC | PRN

## 2024-11-25 MED ORDER — HEPARIN SODIUM (PORCINE) 1000 UNIT/ML IJ SOLN
INTRAMUSCULAR | Status: AC
  Filled 2024-11-25: qty 10

## 2024-11-25 SURGICAL SUPPLY — 15 items
CATH ABLAT QDOT MICRO BI TC DF (CATHETERS) IMPLANT
CATH CRD2 QUAD 6FR 5 (CATHETERS) IMPLANT
CATH DECANAV F CURVE (CATHETERS) IMPLANT
CATH GE 8FR SOUNDSTAR (CATHETERS) IMPLANT
CATH JOSEPH QUAD ALLRED 6F REP (CATHETERS) IMPLANT
CLOSURE PERCLOSE PROSTYLE (Vascular Products) IMPLANT
KIT CATH VERSACROSS STEERABLE (CATHETERS) IMPLANT
PACK EP LF (CUSTOM PROCEDURE TRAY) ×1 IMPLANT
PAD DEFIB RADIO PHYSIO CONN (PAD) ×1 IMPLANT
PATCH CARTO3 (PAD) IMPLANT
SHEATH PINNACLE 7F 10CM (SHEATH) IMPLANT
SHEATH PINNACLE 8F 10CM (SHEATH) IMPLANT
SHEATH PINNACLE 9F 10CM (SHEATH) IMPLANT
SHEATH PROBE COVER 6X72 (BAG) IMPLANT
TUBING SMART ABLATE COOLFLOW (TUBING) IMPLANT

## 2024-11-25 NOTE — Transfer of Care (Signed)
 Immediate Anesthesia Transfer of Care Note  Patient: Katrina Warren  Procedure(s) Performed: SVT ABLATION  Patient Location: Cath Lab  Anesthesia Type:MAC  Level of Consciousness: awake, alert , and oriented  Airway & Oxygen Therapy: Patient Spontanous Breathing  Post-op Assessment: Report given to RN and Post -op Vital signs reviewed and stable  Post vital signs: Reviewed and stable  Last Vitals:  Vitals Value Taken Time  BP 120/80   Temp 36.4 C 11/25/24 13:19  Pulse 62 11/25/24 13:20  Resp 13 11/25/24 13:20  SpO2 100 % 11/25/24 13:20  Vitals shown include unfiled device data.  Last Pain:  Vitals:   11/25/24 1319  TempSrc: Oral  PainSc:          Complications: There were no known notable events for this encounter.

## 2024-11-25 NOTE — Discharge Instructions (Signed)

## 2024-11-25 NOTE — Telephone Encounter (Signed)
 Orthotics are in. Balance-$100.18 [place] patient.

## 2024-11-25 NOTE — Progress Notes (Signed)
 Discharge instructions reviewed with patient and spouse at bedside. Denies questions concerns. PT tolerated PO intake. Ambulated in the hallway, was able to void without difficulty. Seen by MD, incision site remains clean dry and intact. No s/s of complications. PT escorted from the unit via wheel chair to personal vehicle.

## 2024-11-25 NOTE — H&P (Signed)
 Electrophysiology Note:   Date:  11/25/24 ID:  Katrina Warren, DOB [birth year], MRN [id number]   Primary Cardiologist: Victory LELON Claudene DOUGLAS, MD (Inactive) Electrophysiologist: Fonda Kitty, MD       History of Present Illness:   Katrina Warren is a 63 y.o. female with h/o palpitations and SVT who is being seen today for EP evaluation.   Discussed the use of AI scribe software for clinical note transcription with the patient, who gave verbal consent to proceed.   History of Present Illness Katrina Warren is a 63 year old female with episodes of supraventricular tachycardia who presents with fast heartbeats. She was referred by Artist Pouch, PA for evaluation of her heart rhythm issues.   She experiences episodes of fast heartbeats approximately three to four times a month, which have become more noticeable with age. The most severe episode occurred one night after exercising and during dinner, with her heart rate increasing while sitting and eating, lasting for a couple of hours. Most episodes last between 15 to 30 minutes, with the longest recorded episode on a heart monitor being 34 minutes. She finds these episodes concerning as she cannot stop them once they start.   She has been on metoprolol , which has helped to some extent in controlling the episodes, but it has not completely resolved the issue. She has not experienced any episodes that have led to syncope or required emergency medical services.   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    Interval: Patient presents today for planned ablation. Had 1 sustained SVT episode the other night. Reports feeling relatively well otherwise. No new or acute complaints.   Review of systems complete and found to be negative unless listed in HPI.    EP Information / Studies Reviewed:      EKG Interpretation Date/Time:                  Tuesday September 10 2024 15:03:26  EDT Ventricular Rate:         60 PR Interval:                 150 QRS Duration:             90 QT Interval:                 408 QTC Calculation:408 R Axis:                         50   Text Interpretation:      Normal sinus rhythm with subtle pre-excitation / delta wave When compared with ECG of 04-Apr-2018 17:47,  Delta wave more prominent today Confirmed by Kitty Fonda [phone]) on 09/11/2024 10:08:01 PM    [facility] 08/22/24: Patient had a minimum heart rate of 47 bpm, maximum heart rate of 171 bpm, and average heart rate of 76 bpm.  Predominant underlying rhythm was sinus rhythm.  One episode of symptomatic regular SVT; 34 minutes, average rate of 151 bpm.  Isolated PACs were rare (<1.0%).  Isolated PVCs were rare (<1.0%).  Triggered and diary events associated with SVT as above and sinus rhythm.   Symptomatic, sustained, SVT (one episode).     CT Cardiac Scoring 09/11/23:  Impression: Coronary calcium  score of 60.6. Percent ranking not calculated due to unspecified racial group, however, this score equates to the 78-83rd percentile for all races.  Physical Exam:    Today's Vitals   11/25/24 0819 11/25/24 0836  BP:  138/84  Pulse:  78  Resp:  16  Temp:  98.2 F (36.8 C)  TempSrc:  Oral  SpO2:  100%  Weight: 54 kg   Height: 5' 0.5 (1.537 m)   PainSc: 0-No pain    Body mass index is 22.86 kg/m.   GEN: Well nourished, well developed in no acute distress NECK: No JVD CARDIAC: Normal rate, regular rhythm RESPIRATORY:  Clear to auscultation without rales, wheezing or rhonchi  ABDOMEN: Soft, non-distended EXTREMITIES:  No edema; No deformity    ASSESSMENT AND PLAN:     #SVT: Symptomatic and sustained.  Appears to have subtle preexcitation on her baseline ECG.  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 #Palpitations -She continues to have SVT episodes despite metoprolol .  She has increased this over the years.  We discussed escalation to antiarrhythmic drug  therapy with flecainide for suppression versus EP study and catheter ablation.  Risk, benefits of EP study and radiofrequency ablation for SVT were also discussed in detail today. These risks include but are not limited to complete heart block, stroke, bleeding, vascular damage, tamponade, perforation, and death. The patient understands these risk and wishes to proceed.  We will therefore proceed with catheter ablation today.     Follow up with Dr. Kennyth 3 months after ablation.    Signed, Fonda Kennyth, MD

## 2024-11-25 NOTE — Anesthesia Preprocedure Evaluation (Signed)
 Anesthesia Evaluation  Patient identified by MRN, date of birth, ID band Patient awake    Reviewed: Allergy & Precautions, NPO status , Patient's Chart, lab work & pertinent test results  Airway Mallampati: II  TM Distance: >3 FB Neck ROM: Full    Dental no notable dental hx.    Pulmonary neg pulmonary ROS, former smoker   Pulmonary exam normal        Cardiovascular hypertension, Pt. on medications and Pt. on home beta blockers  Rhythm:Irregular Rate:Normal  ECHO: IMPRESSIONS   1. Left ventricular ejection fraction, by estimation, is 55 to 60%. Left ventricular ejection fraction by 3D volume is 56 %. The left ventricle has normal function. The left ventricle has no regional wall motion abnormalities. Left ventricular diastolic  parameters were normal. The average left ventricular global longitudinal strain is -24.1 %. The global longitudinal strain is normal.  2. Right ventricular systolic function is normal. The right ventricular size is normal.  3. The mitral valve is normal in structure. Mild mitral valve regurgitation. No evidence of mitral stenosis.  4. The aortic valve is normal in structure. Aortic valve regurgitation is not visualized. No aortic stenosis is present.  5. The inferior vena cava is normal in size with greater than 50% respiratory variability, suggesting right atrial pressure of 3 mmHg.   Comparison(s): No significant change from prior study.    Neuro/Psych negative neurological ROS  negative psych ROS   GI/Hepatic negative GI ROS, Neg liver ROS,,,  Endo/Other  negative endocrine ROS    Renal/GU negative Renal ROS  negative genitourinary   Musculoskeletal negative musculoskeletal ROS (+)    Abdominal Normal abdominal exam  (+)   Peds  Hematology Lab Results      Component                Value               Date                      WBC                      4.4                 11/05/2024                 HGB                      12.3                11/05/2024                HCT                      38.3                11/05/2024                MCV                      98 (H)              11/05/2024                PLT                      231  11/05/2024              Anesthesia Other Findings   Reproductive/Obstetrics                              Anesthesia Physical Anesthesia Plan  ASA: 3  Anesthesia Plan: MAC   Post-op Pain Management:    Induction: Intravenous  PONV Risk Score and Plan: 2 and Propofol  infusion, Treatment may vary due to age or medical condition, Ondansetron  and Dexamethasone  Airway Management Planned: Simple Face Mask and Nasal Cannula  Additional Equipment: None  Intra-op Plan:   Post-operative Plan:   Informed Consent: I have reviewed the patients History and Physical, chart, labs and discussed the procedure including the risks, benefits and alternatives for the proposed anesthesia with the patient or authorized representative who has indicated his/her understanding and acceptance.     Dental advisory given  Plan Discussed with: CRNA  Anesthesia Plan Comments:         Anesthesia Quick Evaluation

## 2024-11-25 NOTE — Anesthesia Postprocedure Evaluation (Signed)
 Anesthesia Post Note  Patient: Katrina Warren  Procedure(s) Performed: SVT ABLATION     Patient location during evaluation: PACU Anesthesia Type: MAC Level of consciousness: awake and alert Pain management: pain level controlled Vital Signs Assessment: post-procedure vital signs reviewed and stable Respiratory status: spontaneous breathing, nonlabored ventilation, respiratory function stable and patient connected to nasal cannula oxygen Cardiovascular status: stable and blood pressure returned to baseline Postop Assessment: no apparent nausea or vomiting Anesthetic complications: no   There were no known notable events for this encounter.  Last Vitals:  Vitals:   11/25/24 1600 11/25/24 1712  BP: 121/80 117/89  Pulse: 73 67  Resp: 17 17  Temp:    SpO2: 99% 99%    Last Pain:  Vitals:   11/25/24 1712  TempSrc:   PainSc: 0-No pain                 Katrina Warren

## 2024-11-26 ENCOUNTER — Telehealth (HOSPITAL_COMMUNITY): Payer: Self-pay

## 2024-11-26 NOTE — Telephone Encounter (Signed)
 Spoke with patient to complete post procedure follow up call.  Patient reports no complications with groin sites.   Instructions reviewed with patient:  Remove large bandage at puncture site after 24 hours. It is normal to have bruising, tenderness, mild swelling, and a pea or marble sized lump/knot at the groin site which can take up to three months to resolve.  Get help right away if you notice sudden swelling at the puncture site.  Check your puncture site every day for signs of infection: fever, redness, swelling, pus drainage, warmth, foul odor or excessive pain. If this occurs, please call [phone], to speak with the RN Navigator. Get help right away if your puncture site is bleeding and the bleeding does not stop after applying firm pressure to the area.  You may continue to have skipped beats during the first several months after your procedure.  You will follow up with the APP 4 weeks after your procedure.  Activity restrictions reviewed.  Patient verbalized understanding to all instructions provided.

## 2024-11-26 NOTE — Telephone Encounter (Signed)
 12/9 LVM for pt to call office to schedule appt to [facility]

## 2024-11-27 MED FILL — Bupivacaine HCl Preservative Free (PF) Inj 0.25%: INTRAMUSCULAR | Qty: 30 | Status: AC

## 2024-11-29 ENCOUNTER — Ambulatory Visit

## 2024-11-29 DIAGNOSIS — M722 Plantar fascial fibromatosis: Secondary | ICD-10-CM

## 2024-11-29 NOTE — Progress Notes (Signed)
 Patient presents today to pick up custom molded foot orthotics recommended by Dr. Pasco Ovens.   Orthotics were dispensed and fit was satisfactory. Reviewed instructions for break-in and wear. Written instructions given to patient.  Patient will follow up as needed.   [facility] - order # [id number]

## 2024-11-29 NOTE — Patient Instructions (Signed)

## 2024-12-02 ENCOUNTER — Encounter: Payer: Self-pay | Admitting: Cardiology

## 2024-12-25 NOTE — Progress Notes (Signed)
" °  Electrophysiology Office Note:   Date:  12/26/2024  ID:  Katrina Warren, DOB 1961/05/24, MRN [id number]  Primary Cardiologist: Stanly DELENA Leavens, MD Electrophysiologist: Fonda Kitty, MD   Electrophysiologist:  Fonda Kitty, MD      History of Present Illness:   Katrina Warren is a 64 y.o. female with h/o palpitations and SVT seen today for routine electrophysiology follow-up s/p Ablation.  Since last being seen in our clinic the patient reports doing very well. Her groin was sore for a couple of weeks, but she walked 2 miles on POD#7 and attributed to doing to much too soon. Otherwise, she denies chest pain, palpitations, dyspnea, PND, orthopnea, nausea, vomiting, dizziness, syncope, edema, weight gain, or early satiety.  No breakthrough arrhythmia of which she is aware.   Review of systems complete and found to be negative unless listed in HPI.   EP Information / Studies Reviewed:    EKG is ordered today. Personal review as below.  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   Physical Exam:   VS:  BP 114/70   Pulse 76   Ht 5' 0.5 (1.537 m)   Wt 123 lb (55.8 kg)   SpO2 98%   BMI 23.63 kg/m    Wt Readings from Last 3 Encounters:  12/26/24 123 lb (55.8 kg)  11/25/24 119 lb (54 kg)  10/23/24 122 lb (55.3 kg)     GEN: No acute distress NECK: No JVD; No carotid bruits CARDIAC: Regular rate and rhythm, no murmurs, rubs, gallops RESPIRATORY:  Clear to auscultation without rales, wheezing or rhonchi  ABDOMEN: Soft,  non-tender, non-distended EXTREMITIES:  No edema; No deformity   ASSESSMENT AND PLAN:    SVT S/p SVT ablation 11/25/2024 - 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 At that point, can see EP as needed if no further arrhythmia   Signed, Ozell Prentice Lesia, PA-C  "

## 2024-12-26 ENCOUNTER — Encounter: Payer: Self-pay | Admitting: Student

## 2024-12-26 ENCOUNTER — Ambulatory Visit: Attending: Student | Admitting: Student

## 2024-12-26 VITALS — BP 114/70 | HR 76 | Ht 60.5 in | Wt 123.0 lb

## 2024-12-26 DIAGNOSIS — I471 Supraventricular tachycardia, unspecified: Secondary | ICD-10-CM | POA: Diagnosis not present

## 2024-12-26 NOTE — Patient Instructions (Signed)
 Medication Instructions:  STOP Metoprolol  *If you need a refill on your cardiac medications before your next appointment, please call your pharmacy*  Lab Work: No labwork ordered today. If you have labs (blood work) drawn today and your tests are completely normal, you will receive your results only by: MyChart Message (if you have MyChart) OR A paper copy in the mail If you have any lab test that is abnormal or we need to change your treatment, we will call you to review the results.  Testing/Procedures: No testing ordered today  Follow-Up: At [facility], you and your health needs are our priority.  As part of our continuing mission to provide you with exceptional heart care, our providers are all part of one team.  This team includes your primary Cardiologist (physician) and Advanced Practice Providers or APPs (Physician Assistants and Nurse Practitioners) who all work together to provide you with the care you need, when you need it.  Your next appointment:   6 month(s)  Provider:   You may see Fonda Kitty, MD or one of the following Advanced Practice Providers on your designated Care Team:   Charlies Arthur, PA-C Travaris Kosh Andy Celese Banner, PA-C Suzann Riddle, NP Daphne Barrack, NP    We recommend signing up for the patient portal called MyChart.  Sign up information is provided on this After Visit Summary.  MyChart is used to connect with patients for Virtual Visits (Telemedicine).  Patients are able to view lab/test results, encounter notes, upcoming appointments, etc.  Non-urgent messages can be sent to your provider as well.   To learn more about what you can do with MyChart, go to [email or web address].
